# Patient Record
Sex: Female | Born: 1935 | Race: White | Hispanic: No | Marital: Married | Smoking: Never smoker
Health system: Southern US, Community
[De-identification: ages and names within clinical notes are randomized; demographics above are authoritative.]

## PROBLEM LIST (undated history)

## (undated) DIAGNOSIS — E079 Disorder of thyroid, unspecified: Secondary | ICD-10-CM

## (undated) DIAGNOSIS — R002 Palpitations: Secondary | ICD-10-CM

## (undated) DIAGNOSIS — I2510 Atherosclerotic heart disease of native coronary artery without angina pectoris: Secondary | ICD-10-CM

## (undated) DIAGNOSIS — E785 Hyperlipidemia, unspecified: Secondary | ICD-10-CM

## (undated) DIAGNOSIS — I2699 Other pulmonary embolism without acute cor pulmonale: Secondary | ICD-10-CM

## (undated) DIAGNOSIS — I10 Essential (primary) hypertension: Secondary | ICD-10-CM

## (undated) HISTORY — DX: Other pulmonary embolism without acute cor pulmonale: I26.99

## (undated) HISTORY — PX: ABDOMINAL HYSTERECTOMY: SHX81

## (undated) HISTORY — PX: TONSILLECTOMY: SUR1361

## (undated) HISTORY — PX: CHOLECYSTECTOMY: SHX55

## (undated) HISTORY — DX: Palpitations: R00.2

## (undated) HISTORY — PX: APPENDECTOMY: SHX54

## (undated) HISTORY — PX: OTHER SURGICAL HISTORY: SHX169

## (undated) HISTORY — DX: Atherosclerotic heart disease of native coronary artery without angina pectoris: I25.10

## (undated) HISTORY — DX: Hyperlipidemia, unspecified: E78.5

---

## 1999-09-16 ENCOUNTER — Other Ambulatory Visit: Admission: RE | Admit: 1999-09-16 | Discharge: 1999-09-16 | Payer: Self-pay | Admitting: Obstetrics & Gynecology

## 2000-07-31 ENCOUNTER — Inpatient Hospital Stay (HOSPITAL_COMMUNITY): Admission: EM | Admit: 2000-07-31 | Discharge: 2000-08-01 | Payer: Self-pay | Admitting: Cardiology

## 2000-11-22 ENCOUNTER — Emergency Department (HOSPITAL_COMMUNITY): Admission: EM | Admit: 2000-11-22 | Discharge: 2000-11-22 | Payer: Self-pay | Admitting: *Deleted

## 2001-10-13 ENCOUNTER — Emergency Department (HOSPITAL_COMMUNITY): Admission: EM | Admit: 2001-10-13 | Discharge: 2001-10-13 | Payer: Self-pay | Admitting: *Deleted

## 2001-10-14 ENCOUNTER — Emergency Department (HOSPITAL_COMMUNITY): Admission: EM | Admit: 2001-10-14 | Discharge: 2001-10-14 | Payer: Self-pay | Admitting: Internal Medicine

## 2001-11-24 ENCOUNTER — Other Ambulatory Visit: Admission: RE | Admit: 2001-11-24 | Discharge: 2001-11-24 | Payer: Self-pay | Admitting: Obstetrics & Gynecology

## 2002-01-26 ENCOUNTER — Emergency Department (HOSPITAL_COMMUNITY): Admission: EM | Admit: 2002-01-26 | Discharge: 2002-01-26 | Payer: Self-pay | Admitting: Emergency Medicine

## 2002-01-27 ENCOUNTER — Encounter: Payer: Self-pay | Admitting: Internal Medicine

## 2002-01-27 ENCOUNTER — Observation Stay (HOSPITAL_COMMUNITY): Admission: EM | Admit: 2002-01-27 | Discharge: 2002-01-28 | Payer: Self-pay | Admitting: Internal Medicine

## 2002-01-27 ENCOUNTER — Encounter: Payer: Self-pay | Admitting: Family Medicine

## 2002-01-28 ENCOUNTER — Encounter: Payer: Self-pay | Admitting: Family Medicine

## 2002-05-15 ENCOUNTER — Encounter (INDEPENDENT_AMBULATORY_CARE_PROVIDER_SITE_OTHER): Payer: Self-pay | Admitting: *Deleted

## 2002-05-15 ENCOUNTER — Encounter (INDEPENDENT_AMBULATORY_CARE_PROVIDER_SITE_OTHER): Payer: Self-pay | Admitting: Internal Medicine

## 2002-05-15 ENCOUNTER — Ambulatory Visit (HOSPITAL_COMMUNITY): Admission: RE | Admit: 2002-05-15 | Discharge: 2002-05-15 | Payer: Self-pay | Admitting: Internal Medicine

## 2002-09-18 ENCOUNTER — Encounter (INDEPENDENT_AMBULATORY_CARE_PROVIDER_SITE_OTHER): Payer: Self-pay | Admitting: Internal Medicine

## 2002-09-18 ENCOUNTER — Ambulatory Visit (HOSPITAL_COMMUNITY): Admission: RE | Admit: 2002-09-18 | Discharge: 2002-09-18 | Payer: Self-pay | Admitting: Internal Medicine

## 2002-10-12 ENCOUNTER — Ambulatory Visit (HOSPITAL_COMMUNITY): Admission: RE | Admit: 2002-10-12 | Discharge: 2002-10-12 | Payer: Self-pay | Admitting: Pulmonary Disease

## 2002-10-20 ENCOUNTER — Ambulatory Visit (HOSPITAL_COMMUNITY): Admission: RE | Admit: 2002-10-20 | Discharge: 2002-10-20 | Payer: Self-pay | Admitting: Internal Medicine

## 2002-12-01 ENCOUNTER — Other Ambulatory Visit: Admission: RE | Admit: 2002-12-01 | Discharge: 2002-12-01 | Payer: Self-pay | Admitting: Obstetrics & Gynecology

## 2003-11-20 ENCOUNTER — Ambulatory Visit (HOSPITAL_COMMUNITY): Admission: RE | Admit: 2003-11-20 | Discharge: 2003-11-20 | Payer: Self-pay | Admitting: Ophthalmology

## 2003-12-18 ENCOUNTER — Ambulatory Visit (HOSPITAL_COMMUNITY): Admission: RE | Admit: 2003-12-18 | Discharge: 2003-12-18 | Payer: Self-pay | Admitting: Ophthalmology

## 2004-06-17 ENCOUNTER — Encounter: Admission: RE | Admit: 2004-06-17 | Discharge: 2004-06-17 | Payer: Self-pay | Admitting: Orthopedic Surgery

## 2004-06-18 ENCOUNTER — Ambulatory Visit (HOSPITAL_BASED_OUTPATIENT_CLINIC_OR_DEPARTMENT_OTHER): Admission: RE | Admit: 2004-06-18 | Discharge: 2004-06-18 | Payer: Self-pay | Admitting: Orthopedic Surgery

## 2004-06-18 ENCOUNTER — Ambulatory Visit (HOSPITAL_COMMUNITY): Admission: RE | Admit: 2004-06-18 | Discharge: 2004-06-18 | Payer: Self-pay | Admitting: Orthopedic Surgery

## 2004-06-18 ENCOUNTER — Encounter (INDEPENDENT_AMBULATORY_CARE_PROVIDER_SITE_OTHER): Payer: Self-pay | Admitting: *Deleted

## 2004-10-02 ENCOUNTER — Ambulatory Visit (HOSPITAL_COMMUNITY): Admission: RE | Admit: 2004-10-02 | Discharge: 2004-10-02 | Payer: Self-pay | Admitting: Pulmonary Disease

## 2004-11-07 ENCOUNTER — Ambulatory Visit: Payer: Self-pay

## 2004-12-23 ENCOUNTER — Other Ambulatory Visit: Admission: RE | Admit: 2004-12-23 | Discharge: 2004-12-23 | Payer: Self-pay | Admitting: Obstetrics & Gynecology

## 2005-03-17 ENCOUNTER — Emergency Department (HOSPITAL_COMMUNITY): Admission: EM | Admit: 2005-03-17 | Discharge: 2005-03-18 | Payer: Self-pay | Admitting: *Deleted

## 2005-06-29 ENCOUNTER — Ambulatory Visit: Payer: Self-pay | Admitting: Internal Medicine

## 2005-07-01 ENCOUNTER — Ambulatory Visit (HOSPITAL_COMMUNITY): Admission: RE | Admit: 2005-07-01 | Discharge: 2005-07-01 | Payer: Self-pay | Admitting: Internal Medicine

## 2005-07-17 ENCOUNTER — Ambulatory Visit: Payer: Self-pay | Admitting: Gastroenterology

## 2005-07-20 ENCOUNTER — Ambulatory Visit: Payer: Self-pay | Admitting: Cardiology

## 2005-08-12 ENCOUNTER — Ambulatory Visit (HOSPITAL_COMMUNITY): Admission: RE | Admit: 2005-08-12 | Discharge: 2005-08-12 | Payer: Self-pay | Admitting: Gastroenterology

## 2005-08-12 ENCOUNTER — Ambulatory Visit: Payer: Self-pay | Admitting: Gastroenterology

## 2005-09-01 ENCOUNTER — Inpatient Hospital Stay (HOSPITAL_COMMUNITY): Admission: RE | Admit: 2005-09-01 | Discharge: 2005-09-07 | Payer: Self-pay | Admitting: Surgery

## 2006-01-26 ENCOUNTER — Inpatient Hospital Stay (HOSPITAL_COMMUNITY): Admission: EM | Admit: 2006-01-26 | Discharge: 2006-01-31 | Payer: Self-pay | Admitting: Emergency Medicine

## 2006-02-02 ENCOUNTER — Ambulatory Visit: Payer: Self-pay | Admitting: Cardiology

## 2006-02-05 ENCOUNTER — Ambulatory Visit: Payer: Self-pay | Admitting: *Deleted

## 2006-02-11 ENCOUNTER — Ambulatory Visit: Payer: Self-pay | Admitting: *Deleted

## 2006-02-11 ENCOUNTER — Emergency Department (HOSPITAL_COMMUNITY): Admission: EM | Admit: 2006-02-11 | Discharge: 2006-02-11 | Payer: Self-pay | Admitting: Emergency Medicine

## 2006-02-19 ENCOUNTER — Ambulatory Visit: Payer: Self-pay | Admitting: Cardiology

## 2006-03-02 ENCOUNTER — Ambulatory Visit: Payer: Self-pay | Admitting: *Deleted

## 2006-03-16 ENCOUNTER — Ambulatory Visit: Payer: Self-pay | Admitting: *Deleted

## 2006-04-01 ENCOUNTER — Ambulatory Visit: Payer: Self-pay | Admitting: Cardiology

## 2006-04-19 ENCOUNTER — Ambulatory Visit: Payer: Self-pay | Admitting: Cardiology

## 2006-05-17 ENCOUNTER — Ambulatory Visit: Payer: Self-pay | Admitting: Cardiology

## 2006-05-26 ENCOUNTER — Ambulatory Visit: Payer: Self-pay | Admitting: Cardiology

## 2006-05-28 ENCOUNTER — Ambulatory Visit: Payer: Self-pay | Admitting: Cardiology

## 2006-05-28 ENCOUNTER — Encounter (HOSPITAL_COMMUNITY): Admission: RE | Admit: 2006-05-28 | Discharge: 2006-06-27 | Payer: Self-pay | Admitting: Cardiology

## 2006-05-31 ENCOUNTER — Ambulatory Visit (HOSPITAL_COMMUNITY): Admission: RE | Admit: 2006-05-31 | Discharge: 2006-05-31 | Payer: Self-pay | Admitting: *Deleted

## 2006-06-03 ENCOUNTER — Ambulatory Visit: Payer: Self-pay | Admitting: Cardiology

## 2006-06-29 ENCOUNTER — Ambulatory Visit: Payer: Self-pay | Admitting: Cardiology

## 2006-07-27 ENCOUNTER — Ambulatory Visit: Payer: Self-pay | Admitting: Cardiology

## 2006-08-06 ENCOUNTER — Ambulatory Visit: Payer: Self-pay | Admitting: Cardiology

## 2006-12-03 ENCOUNTER — Ambulatory Visit (HOSPITAL_COMMUNITY): Admission: RE | Admit: 2006-12-03 | Discharge: 2006-12-03 | Payer: Self-pay | Admitting: Pulmonary Disease

## 2006-12-27 ENCOUNTER — Emergency Department (HOSPITAL_COMMUNITY): Admission: EM | Admit: 2006-12-27 | Discharge: 2006-12-27 | Payer: Self-pay | Admitting: Emergency Medicine

## 2008-02-02 ENCOUNTER — Ambulatory Visit (HOSPITAL_COMMUNITY): Admission: RE | Admit: 2008-02-02 | Discharge: 2008-02-02 | Payer: Self-pay | Admitting: Pulmonary Disease

## 2008-03-31 ENCOUNTER — Ambulatory Visit: Payer: Self-pay | Admitting: Cardiology

## 2008-04-02 ENCOUNTER — Inpatient Hospital Stay (HOSPITAL_COMMUNITY): Admission: AD | Admit: 2008-04-02 | Discharge: 2008-04-03 | Payer: Self-pay | Admitting: Cardiology

## 2008-04-11 ENCOUNTER — Ambulatory Visit: Payer: Self-pay | Admitting: Cardiology

## 2008-05-22 ENCOUNTER — Ambulatory Visit: Payer: Self-pay | Admitting: Cardiology

## 2008-10-16 ENCOUNTER — Encounter: Payer: Self-pay | Admitting: Cardiology

## 2008-10-16 ENCOUNTER — Ambulatory Visit: Payer: Self-pay | Admitting: Cardiology

## 2008-10-23 ENCOUNTER — Ambulatory Visit: Payer: Self-pay

## 2009-02-12 ENCOUNTER — Encounter: Payer: Self-pay | Admitting: Cardiology

## 2009-03-08 ENCOUNTER — Encounter (INDEPENDENT_AMBULATORY_CARE_PROVIDER_SITE_OTHER): Payer: Self-pay | Admitting: *Deleted

## 2009-03-10 ENCOUNTER — Encounter: Payer: Self-pay | Admitting: Cardiology

## 2009-03-10 ENCOUNTER — Emergency Department (HOSPITAL_COMMUNITY): Admission: EM | Admit: 2009-03-10 | Discharge: 2009-03-10 | Payer: Self-pay | Admitting: Emergency Medicine

## 2009-03-11 ENCOUNTER — Telehealth: Payer: Self-pay | Admitting: Cardiology

## 2009-03-12 ENCOUNTER — Telehealth: Payer: Self-pay | Admitting: Cardiology

## 2009-03-12 ENCOUNTER — Ambulatory Visit: Payer: Self-pay | Admitting: Cardiology

## 2009-03-12 DIAGNOSIS — R002 Palpitations: Secondary | ICD-10-CM

## 2009-03-12 DIAGNOSIS — R079 Chest pain, unspecified: Secondary | ICD-10-CM | POA: Insufficient documentation

## 2009-03-12 DIAGNOSIS — E039 Hypothyroidism, unspecified: Secondary | ICD-10-CM

## 2009-03-12 DIAGNOSIS — E119 Type 2 diabetes mellitus without complications: Secondary | ICD-10-CM | POA: Insufficient documentation

## 2009-03-12 DIAGNOSIS — E785 Hyperlipidemia, unspecified: Secondary | ICD-10-CM

## 2009-03-12 DIAGNOSIS — Z86711 Personal history of pulmonary embolism: Secondary | ICD-10-CM

## 2009-03-15 ENCOUNTER — Telehealth: Payer: Self-pay | Admitting: Cardiology

## 2009-05-23 ENCOUNTER — Encounter: Payer: Self-pay | Admitting: Cardiology

## 2009-12-23 ENCOUNTER — Telehealth: Payer: Self-pay | Admitting: Cardiology

## 2009-12-24 DIAGNOSIS — I214 Non-ST elevation (NSTEMI) myocardial infarction: Secondary | ICD-10-CM | POA: Insufficient documentation

## 2009-12-24 DIAGNOSIS — I10 Essential (primary) hypertension: Secondary | ICD-10-CM

## 2009-12-24 DIAGNOSIS — I2510 Atherosclerotic heart disease of native coronary artery without angina pectoris: Secondary | ICD-10-CM

## 2009-12-25 ENCOUNTER — Ambulatory Visit: Payer: Self-pay | Admitting: Cardiology

## 2009-12-25 DIAGNOSIS — R609 Edema, unspecified: Secondary | ICD-10-CM

## 2010-04-02 ENCOUNTER — Ambulatory Visit: Payer: Self-pay | Admitting: Cardiology

## 2010-04-02 DIAGNOSIS — R0602 Shortness of breath: Secondary | ICD-10-CM | POA: Insufficient documentation

## 2010-04-15 ENCOUNTER — Telehealth (INDEPENDENT_AMBULATORY_CARE_PROVIDER_SITE_OTHER): Payer: Self-pay | Admitting: *Deleted

## 2010-04-16 ENCOUNTER — Encounter (HOSPITAL_COMMUNITY): Admission: RE | Admit: 2010-04-16 | Discharge: 2010-06-10 | Payer: Self-pay | Admitting: Cardiology

## 2010-04-16 ENCOUNTER — Ambulatory Visit: Payer: Self-pay | Admitting: Cardiovascular Disease

## 2010-04-16 ENCOUNTER — Encounter: Payer: Self-pay | Admitting: Cardiovascular Disease

## 2010-04-16 ENCOUNTER — Ambulatory Visit: Payer: Self-pay

## 2010-04-28 ENCOUNTER — Telehealth: Payer: Self-pay | Admitting: Cardiovascular Disease

## 2010-08-20 ENCOUNTER — Encounter (INDEPENDENT_AMBULATORY_CARE_PROVIDER_SITE_OTHER): Payer: Self-pay | Admitting: *Deleted

## 2010-09-09 NOTE — Progress Notes (Signed)
Summary: Disuss medication   Phone Note Call from Patient Call back at Home Phone [phone]   Caller: Patient Reason for Call: Talk to Nurse Details for Reason: Discuss medication  Initial call taken by: Lorne Skeens,  Dec 23, 2009 11:41 AM  Follow-up for Phone Call        SPOKE WITH PT  RECIEVED LETTER STATING TIME  TO SCHEDULE 6 MO APPT  WANTING TO SEE DR Demira Gwynne IN [place] NOTHING AVAILABLE UNTIL JULY PER PT WILL KEEP APPT FOR [place] OFFICE ON 12/25/09. Follow-up by: Scherrie Bateman, LPN,  Dec 23, 2009 2:06 PM

## 2010-09-09 NOTE — Assessment & Plan Note (Signed)
Summary: 3 month rov.sl   Visit Type:  3 mo f/u Primary Abel Ra:  Shaune Pollack  CC:  pt states she had some chest discomfort on the real hot days....sob as well on hot days....denies any edema.  History of Present Illness: [name] [name] comes in today complaining of shortness of breath and chest tightness with the extreme heat conditions. She has a history of coronary disease status post drug-eluting stent to the right coronary artery. She has normal left ventricular function. Stress Myoview in March of 2000 and showed no significant ischemia.  She denies any pleuritic chest pain, hemoptysis, or sudden change in shortness of breath. She has a history of a pulmonary embolus.  Her blood pressure is under good control. Her weight has been stable. She denies PND, orthopnea, or edema.  Current Medications (verified): 1)  Tricor 145 Mg Tabs (Fenofibrate) .Marland Kitchen.. 1 Tab Once Daily 2)  Synthroid 200 Mcg Tabs (Levothyroxine Sodium) .Marland Kitchen.. 1 Tab Once Daily 3)  Crestor 20 Mg Tabs (Rosuvastatin Calcium) .Marland Kitchen.. 1 Tab Once Daily 4)  Plavix 75 Mg Tabs (Clopidogrel Bisulfate) .Marland Kitchen.. 1 Tab Once Daily 5)  Metoprolol Tartrate 25 Mg Tabs (Metoprolol Tartrate) .Marland Kitchen.. 1 Tab Two Times A Day 6)  Aspirin 81 Mg Tbec (Aspirin) .... Take One Tablet By Mouth Daily 7)  Novolin 70/30 70-30 % Susp (Insulin Isophane & Regular) .... Take As Directed 8)  Nitroglycerin 0.4 Mg Subl (Nitroglycerin) .... One Tablet Under Tongue Every 5 Minutes As Needed For Chest Pain---May Repeat Times Three 9)  Furosemide 20 Mg Tabs (Furosemide) .Marland Kitchen.. 1 Once Daily 10)  Klor-Con M20 20 Meq Cr-Tabs (Potassium Chloride Crys Cr) .Marland Kitchen.. 1 Once Daily  Allergies (verified): No Known Drug Allergies  Past History:  Past Medical History: Last updated: 12/24/2009 MYOCARDIAL INFARCTION, ACUTE, SUBENDOCARDIAL (ICD-410.70) CAD, NATIVE VESSEL (ICD-414.01) CHEST PAIN-UNSPECIFIED (ICD-786.50) PALPITATIONS (ICD-785.1) HYPERTENSION (ICD-401.9) HYPERLIPIDEMIA-MIXED  (ICD-272.4) PE (ICD-415.19) DM (ICD-250.00) HYPOTHYROIDISM (ICD-244.9)    Past Surgical History: Last updated: 03/12/2009 appendectomy,  cholecystectomy,   hysterectomy, tonsillectomy,  abdominal cavity operation   Family History: Last updated: 03/12/2009 Family History of Coronary Artery Disease:  Family History of Hypertension:   Social History: Last updated: 03/12/2009 Tobacco Use - No.  Alcohol Use - no Drug Use - no  Risk Factors: Smoking Status: never (03/12/2009)  Review of Systems       negative other than history of present illness  Vital Signs:  Patient profile:   77 year old female Height:      63 inches Weight:      170.8 pounds BMI:     30.37 Pulse rate:   76 / minute Pulse rhythm:   regular BP sitting:   120 / 60  (left arm) Cuff size:   large  Vitals Entered By: Danielle Rankin, CMA (April 02, 2010 3:06 PM)  Physical Exam  General:  obese.  no acute distress Head:  normocephalic and atraumatic Eyes:  glasses, otherwise normal Neck:  Neck supple, no JVD. No masses, thyromegaly or abnormal cervical nodes. Lungs:  Clear bilaterally to auscultation and percussion. Heart:  PMI nondisplaced, normal S1-S2, no gallop rub or murmur. Carotids are full with no bruits Msk:  decreased ROM.   Pulses:  pulses normal in all 4 extremities Extremities:  isolated edema or cyst of the left malleolus. Trace pretibial edema. Varicose veins without sign of DVT Neurologic:  Alert and oriented x 3. Skin:  Intact without lesions or rashes. Psych:  Normal affect.   Impression & Recommendations:  Problem #  1:  CAD, NATIVE VESSEL (ICD-414.01) I am concerned her symptoms are ischemic equivalent. We'll arrange exercise Myoview off metoprolol. Her updated medication list for this problem includes:    Plavix 75 Mg Tabs (Clopidogrel bisulfate) .Marland Kitchen... 1 tab once daily    Metoprolol Tartrate 25 Mg Tabs (Metoprolol tartrate) .Marland Kitchen... 1 tab two times a day    Aspirin 81 Mg Tbec  (Aspirin) .Marland Kitchen... Take one tablet by mouth daily    Nitroglycerin 0.4 Mg Subl (Nitroglycerin) ..... One tablet under tongue every 5 minutes as needed for chest pain---may repeat times three  Problem # 2:  PE (ICD-415.19) Assessment: Unchanged  Her updated medication list for this problem includes:    Plavix 75 Mg Tabs (Clopidogrel bisulfate) .Marland Kitchen... 1 tab once daily    Aspirin 81 Mg Tbec (Aspirin) .Marland Kitchen... Take one tablet by mouth daily  Problem # 3:  HYPERTENSION (ICD-401.9) Assessment: Improved  Her updated medication list for this problem includes:    Metoprolol Tartrate 25 Mg Tabs (Metoprolol tartrate) .Marland Kitchen... 1 tab two times a day    Aspirin 81 Mg Tbec (Aspirin) .Marland Kitchen... Take one tablet by mouth daily    Furosemide 20 Mg Tabs (Furosemide) .Marland Kitchen... 1 once daily  Problem # 4:  HYPERLIPIDEMIA-MIXED (ICD-272.4)  Her updated medication list for this problem includes:    Tricor 145 Mg Tabs (Fenofibrate) .Marland Kitchen... 1 tab once daily    Crestor 20 Mg Tabs (Rosuvastatin calcium) .Marland Kitchen... 1 tab once daily  Problem # 5:  PALPITATIONS (ICD-785.1) Assessment: Improved  Her updated medication list for this problem includes:    Plavix 75 Mg Tabs (Clopidogrel bisulfate) .Marland Kitchen... 1 tab once daily    Metoprolol Tartrate 25 Mg Tabs (Metoprolol tartrate) .Marland Kitchen... 1 tab two times a day    Aspirin 81 Mg Tbec (Aspirin) .Marland Kitchen... Take one tablet by mouth daily    Nitroglycerin 0.4 Mg Subl (Nitroglycerin) ..... One tablet under tongue every 5 minutes as needed for chest pain---may repeat times three  Clinical Reports Reviewed:  Cardiac Cath:  04/02/2008: Cardiac Cath Findings:   Conclusions:  1. Severe RCA Stenosis with Successful PCI using a Promus DES  2. Mild nonobstructive LAD/LCx stenosis  3. Normal LV function  Recommendations:  Aspirin and Plavix for a minimum of twelve months  Signed By: Tonny Bollman  MD On 04/02/2008 2:06:24 PM  ___________________________________  Tonny Bollman  MD   ___________________________________  Nuclear Study:  10/23/2008:  Excerise capacity: Adenosine study with no exercise  Blood Pressure response: Normal blood pressure response  Clinical symptoms: MIld chest pain/dyspnea  ECG impression: No significant ST segment change suggestive of ischemia  Overall impression: Probably normal adenosine nuclear study. Variable breast attenuation artifact affects basal anterior wall. LVEF 87% and normal three times a day ratio of 0.96.  Janit Bern, MD   05/28/2006:  NM Myo Perf Eject Fr.  Clinical data:   77 year old woman with exertional dyspnea.   ADENOSINE STRESS MYOVIEW STUDY:   RADIONUCLIDE DATA:  One day rest/stress protocol performed with   10.0/30.0 mCi Tc48m Myoview.   STRESS DATA:  Adenosine infusion resulted in malaise and chest   discomfort.  There was a moderate increase in heart rate, but no   substantial change in systolic blood pressure with drug   administration.  No arrhythmias noted.   EKG:  Normal sinus rhythm; delayed R-wave progression; low voltage in   the chest leads; otherwise, unremarkable.  No significant change with   Adenosine.   SCINTIGRAPHIC DATA:  Acquisition notable  for mild breast attenuation.   Left ventricular size was normal.  On tomographic images   reconstructed in standard planes, there was a small focal area of   thinning in the anteroseptal region.  This was not numerically   significant by quantitative analysis, nor was any reversibility   apparent.  The gated reconstruction demonstrated normal to   hyperdynamic regional and global LV systolic function as well as   normal systolic accentuation of activity throughout.  Estimated   ejection fraction exceeded .65.   IMPRESSION:   Negative pharmacologic stress Myoview study revealing no stress -   induced EKG abnormalities, normal left ventricular size, and normal   left ventricular systolic function.  By scintigraphic imaging, there   was a small  breast or right ventricular insertion artifact without   convincing evidence for ischemia or infarction.  Other findings as   noted.    Read By:  [place] Bing,  [name]   Released By:  [place] Bing,  [name]  Additional Information  External image : [phone]  11/07/2004:  Impression: With adenosine infusion there are slight EKG changes. The nuclear images are normal. There is no sign of scar or ischemia. There is normal wall motion. There is a high normal ejection fraction. the left ventricular cavity size is small.  Willa Rough, MD, [facility]  Appended Document: 3 month rov.sl    Clinical Lists Changes  Problems: Added new problem of SHORTNESS OF BREATH (ICD-786.05) Orders: Added new Referral order of Nuclear Stress Test (Nuc Stress Test) - Signed Observations: Added new observation of PI CARDIO: Your physician recommends that you schedule a follow-up appointment in: 6 months with Dr. Daleen Squibb Your physician recommends that you continue on your current medications as directed. Please refer to the Current Medication list given to you today. Your physician has requested that you have an exercise stress myoview.  For further information please visit [email or web address]  Please follow instruction sheet, as given. (04/02/2010 16:00)       Patient Instructions: 1)  Your physician recommends that you schedule a follow-up appointment in: 6 months with Dr. Daleen Squibb 2)  Your physician recommends that you continue on your current medications as directed. Please refer to the Current Medication list given to you today. 3)  Your physician has requested that you have an exercise stress myoview.  For further information please visit [email or web address]  Please follow instruction sheet, as given.

## 2010-09-09 NOTE — Assessment & Plan Note (Signed)
Summary: rov   Visit Type:  rov Primary Provider:  DR. Juanetta Gosling  CC:  sob easily...edema/legs...denies any cp.  History of Present Illness: [name] [name] comes in today for evaluation management of her coronary artery disease, history of hypertension, history of pulmonary embolus, and lower extremity edema.  Her edema has gotten worse over the past several weeks. She says at the end of the day her feet are tight. Her weight is up 57 pounds.  She denies any true orthopnea, PND.  She's had no angina, pleuritic chest pain, or hemoptysis.  Current Medications (verified): 1)  Tricor 145 Mg Tabs (Fenofibrate) .Marland Kitchen.. 1 Tab Once Daily 2)  Amaryl 4 Mg Tabs (Glimepiride) .Marland Kitchen.. 1 Tab Once Daily 3)  Synthroid 200 Mcg Tabs (Levothyroxine Sodium) .Marland Kitchen.. 1 Tab Once Daily 4)  Amiloride-Hydrochlorothiazide 5-50 Mg Tabs (Amiloride-Hydrochlorothiazide) .Marland Kitchen.. 1 Tab Once Daily 5)  Crestor 20 Mg Tabs (Rosuvastatin Calcium) .Marland Kitchen.. 1 Tab Once Daily 6)  Plavix 75 Mg Tabs (Clopidogrel Bisulfate) .Marland Kitchen.. 1 Tab Once Daily 7)  Metoprolol Tartrate 25 Mg Tabs (Metoprolol Tartrate) .Marland Kitchen.. 1 Tab Two Times A Day 8)  Aspirin 81 Mg Tbec (Aspirin) .... Take One Tablet By Mouth Daily 9)  Insulin .... As Directed 10)  Nitroglycerin 0.4 Mg Subl (Nitroglycerin) .... One Tablet Under Tongue Every 5 Minutes As Needed For Chest Pain---May Repeat Times Three  Allergies (verified): No Known Drug Allergies  Review of Systems       negative other than history of present illness  Vital Signs:  Patient profile:   77 year old female Height:      63 inches Weight:      172 pounds BMI:     30.58 Pulse rate:   79 / minute Pulse rhythm:   regular BP sitting:   138 / 70  (left arm) Cuff size:   large  Vitals Entered By: Danielle Rankin, CMA (Dec 25, 2009 3:31 PM)  Physical Exam  General:  Well developed, well nourished, in no acute distress. Head:  normocephalic and atraumatic Eyes:  PERRLA/EOM intact; conjunctiva and lids normal. Neck:   Neck supple, no JVD. No masses, thyromegaly or abnormal cervical nodes. Lungs:  Clear bilaterally to auscultation and percussion. Heart:  Non-displaced PMI, chest non-tender; regular rate and rhythm, S1, S2 without murmurs, rubs or gallops. Carotid upstroke normal, no bruit. Normal abdominal aortic size, no bruits. Femorals normal pulses, no bruits. Pedals normal pulses. No edema, no varicosities. Abdomen:  Bowel sounds positive; abdomen soft and non-tender without masses, organomegaly, or hernias noted. No hepatosplenomegaly. Msk:  decreased ROM.   Pulses:  pulses normal in all 4 extremities Extremities:  1-2+ pitting edema at the ankle, no sign of DVT Neurologic:  Alert and oriented x 3. Skin:  Intact without lesions or rashes. Psych:  Normal affect.   Problems:  Medical Problems Added: 1)  Dx of Edema  (ICD-782.3)  Impression & Recommendations:  Problem # 1:  CAD, NATIVE VESSEL (ICD-414.01) Assessment Unchanged  Her updated medication list for this problem includes:    Plavix 75 Mg Tabs (Clopidogrel bisulfate) .Marland Kitchen... 1 tab once daily    Metoprolol Tartrate 25 Mg Tabs (Metoprolol tartrate) .Marland Kitchen... 1 tab two times a day    Aspirin 81 Mg Tbec (Aspirin) .Marland Kitchen... Take one tablet by mouth daily    Nitroglycerin 0.4 Mg Subl (Nitroglycerin) ..... One tablet under tongue every 5 minutes as needed for chest pain---may repeat times three  Orders: EKG w/ Interpretation (93000)  Problem # 2:  MYOCARDIAL  INFARCTION, ACUTE, SUBENDOCARDIAL (ICD-410.70) Assessment: Unchanged  Her updated medication list for this problem includes:    Plavix 75 Mg Tabs (Clopidogrel bisulfate) .Marland Kitchen... 1 tab once daily    Metoprolol Tartrate 25 Mg Tabs (Metoprolol tartrate) .Marland Kitchen... 1 tab two times a day    Aspirin 81 Mg Tbec (Aspirin) .Marland Kitchen... Take one tablet by mouth daily    Nitroglycerin 0.4 Mg Subl (Nitroglycerin) ..... One tablet under tongue every 5 minutes as needed for chest pain---may repeat times three  Problem #  3:  PALPITATIONS (ICD-785.1) Assessment: Improved  Her updated medication list for this problem includes:    Plavix 75 Mg Tabs (Clopidogrel bisulfate) .Marland Kitchen... 1 tab once daily    Metoprolol Tartrate 25 Mg Tabs (Metoprolol tartrate) .Marland Kitchen... 1 tab two times a day    Aspirin 81 Mg Tbec (Aspirin) .Marland Kitchen... Take one tablet by mouth daily    Nitroglycerin 0.4 Mg Subl (Nitroglycerin) ..... One tablet under tongue every 5 minutes as needed for chest pain---may repeat times three  Problem # 4:  HYPERTENSION (ICD-401.9) Assessment: Improved  Her updated medication list for this problem includes:    Amiloride-hydrochlorothiazide 5-50 Mg Tabs (Amiloride-hydrochlorothiazide) .Marland Kitchen... 1 tab once daily    Metoprolol Tartrate 25 Mg Tabs (Metoprolol tartrate) .Marland Kitchen... 1 tab two times a day    Aspirin 81 Mg Tbec (Aspirin) .Marland Kitchen... Take one tablet by mouth daily  Problem # 5:  PE (ICD-415.19) resolved, off Coumadin Her updated medication list for this problem includes:    Plavix 75 Mg Tabs (Clopidogrel bisulfate) .Marland Kitchen... 1 tab once daily    Aspirin 81 Mg Tbec (Aspirin) .Marland Kitchen... Take one tablet by mouth daily  Problem # 6:  HYPERLIPIDEMIA-MIXED (ICD-272.4)  Her updated medication list for this problem includes:    Tricor 145 Mg Tabs (Fenofibrate) .Marland Kitchen... 1 tab once daily    Crestor 20 Mg Tabs (Rosuvastatin calcium) .Marland Kitchen... 1 tab once daily  Problem # 7:  DM (ICD-250.00)  Her updated medication list for this problem includes:    Amaryl 4 Mg Tabs (Glimepiride) .Marland Kitchen... 1 tab once daily    Aspirin 81 Mg Tbec (Aspirin) .Marland Kitchen... Take one tablet by mouth daily  Problem # 8:  EDEMA (ICD-782.3) Assessment: Deteriorated I will change her HCTZ to furosemide 20 mg per day. Will also add potassium 20 mg a day. If her weight has not dropped 5 pounds or her swelling doesn't get remarkably better, we will increase to 40 mg per day.  Patient Instructions: 1)  Your physician recommends that you schedule a follow-up appointment in: 3 MONTHS WITH  DR Sotero Brinkmeyer 2)  Your physician has recommended you make the following change in your medication: STOP AMILORIDE/HCTZ 5/50 MG 3)  START FUROSEMIDE 20 MG 1 once daily 4)  KLOR CON (POTASSIUM 20 MEQ 1 once daily

## 2010-09-09 NOTE — Progress Notes (Signed)
Summary: Nuclear Pre-Procedure  Phone Note Outgoing Call   Call placed by: Milana Na, EMT-P,  April 15, 2010 4:43 PM Summary of Call: Reviewed information on Myoview Information Sheet (see scanned document for further details).  Spoke with patient.     Nuclear Med Background Indications for Stress Test: Evaluation for Ischemia, Stent Patency   History: Heart Catheterization, Myocardial Infarction, Myocardial Perfusion Study, Stents   Symptoms: Chest Tightness, SOB    Nuclear Pre-Procedure Cardiac Risk Factors: Carotid Disease, Family History - CAD, Hypertension, IDDM Type 2, Lipids Height (in): 63  Nuclear Med Study Referring MD:  T.Wall

## 2010-09-09 NOTE — Progress Notes (Signed)
Summary: some chest pain/results of stress test  Phone Note Call from Patient   Caller: Patient [phone] Reason for Call: Talk to Nurse Summary of Call: pt calling re pain in chest, starting yesterday-pt wants results of stress test -pls call   Initial call taken by: Glynda Jaeger,  April 28, 2010 11:47 AM  Follow-up for Phone Call        adv pt that stress test normal but not signed off by Dr. Juliann Pares. Pt states chest a little tight and took a nitro 5 minutes ago and "helped a little", adv can take q9m x 3 and if not relief call 911. Claris Gladden, RN, BSN 9/19 1155 pt adv chest pain is a lot better and still not shob. Claris Gladden, RN, BSN 9/19 [phone]

## 2010-09-09 NOTE — Assessment & Plan Note (Signed)
Summary: Cardiology Nuclear Testing  Nuclear Med Background Indications for Stress Test: Evaluation for Ischemia, Stent Patency   History: Heart Catheterization, Myocardial Infarction, Myocardial Perfusion Study, Stents  History Comments: '09 Stent-RCA; '10 [id number]  Symptoms: Chest Tightness, Palpitations, Rapid HR  Symptoms Comments: Last episode of CP:04/11/10.   Nuclear Pre-Procedure Cardiac Risk Factors: Carotid Disease, Family History - CAD, Hypertension, IDDM Type 2, Lipids Caffeine/Decaff Intake: None NPO After: 9:00 PM Lungs: Clear.  O2 Sat 98%. IV 0.9% NS with Angio Cath: 22g     IV Site: L Antecubital IV Started by: Bonnita Levan, RN Chest Size (in) 34     Cup Size B     Height (in): 63 Weight (lb): 168 BMI: 29.87  Nuclear Med Study 1 or 2 day study:  1 day     Stress Test Type:  Treadmill/Lexiscan Reading MD:  Charlton Haws, MD     Referring MD:  Valera Castle, MD Resting Radionuclide:  Technetium 41m Tetrofosmin     Resting Radionuclide Dose:  10.9 mCi  Stress Radionuclide:  Technetium 39m Tetrofosmin     Stress Radionuclide Dose:  32.8 mCi   Stress Protocol Exercise Time (min):  2:00 min     Max HR:  139 bpm     Predicted Max HR:  144 bpm  Max Systolic BP: 155 mm Hg     Percent Max HR:  96.53 %Rate Pressure Product:  40981  Lexiscan: 0.4 mg   Stress Test Technologist:  Rea [employer], CMA-N     Nuclear Technologist:  Doyne Keel, CNMT  Rest Procedure  Myocardial perfusion imaging was performed at rest 45 minutes following the intravenous administration of Technetium 22m Tetrofosmin.  Stress Procedure  The patient received IV Lexiscan 0.4 mg over 15-seconds with concurrent low level exercise and then technetium 56m Tetrofosmin was injected at 30-seconds.  There were no significant changes with infusion.  She did c/o chest tightness.  Quantitative spect images were obtained after a 45 minute delay.  QPS Raw Data Images:  Normal; no motion artifact; normal  heart/lung ratio. Stress Images:  Normal homogeneous uptake in all areas of the myocardium. Rest Images:  Normal homogeneous uptake in all areas of the myocardium. Subtraction (SDS):  SDS 2 Transient Ischemic Dilatation:  1.01  (Normal <1.22)  Lung/Heart Ratio:  0.25  (Normal <0.45)  Quantitative Gated Spect Images QGS EDV:  30 ml QGS ESV:  5 ml QGS EF:  82 % QGS cine images:  normal  Findings Normal nuclear study      Overall Impression  Exercise Capacity: Lexiscan with no exercise. BP Response: Normal blood pressure response. Clinical Symptoms: Chest Tightness ECG Impression: Baseline ECG with flat lateral ST segments got slightly worse with bolus Overall Impression: Normal stress nuclear study.  Appended Document: Cardiology Nuclear Testing excellent. no change in treatment.  Appended Document: Cardiology Nuclear Testing No answer. No answering machine. Will call back  Mylo Red RN

## 2010-09-11 NOTE — Letter (Signed)
Summary: Appointment - Reschedule  [employer], Main Office  [address]   [place], [place] [address]   Phone: [phone]  Fax: [phone]     August 20, 2010 MRN: [id number]   [facility] [address] [place], [place]  [address]   Dear Ms. Marbach,   Due to a change in our office schedule, your appointment on  Feb 7,2012                     at  2:30 p.m.  must be changed.  It is very important that we reach you to reschedule this appointment. We look forward to participating in your health care needs. Please contact us at the number listed above at your earliest convenience to reschedule this appointment.     Sincerely,      Lorne Skeens  [facility] Scheduling Team

## 2010-09-15 ENCOUNTER — Encounter: Payer: Self-pay | Admitting: Cardiology

## 2010-09-16 ENCOUNTER — Encounter: Payer: Self-pay | Admitting: Cardiology

## 2010-09-16 ENCOUNTER — Ambulatory Visit (INDEPENDENT_AMBULATORY_CARE_PROVIDER_SITE_OTHER): Payer: MEDICARE | Admitting: Cardiology

## 2010-09-16 DIAGNOSIS — I2510 Atherosclerotic heart disease of native coronary artery without angina pectoris: Secondary | ICD-10-CM

## 2010-09-16 DIAGNOSIS — I214 Non-ST elevation (NSTEMI) myocardial infarction: Secondary | ICD-10-CM

## 2010-09-25 NOTE — Assessment & Plan Note (Signed)
Summary: f85m/dfg per pt call-mj rs from [email or web address]/ep   Visit Type:  51mo follow up Primary Provider:  Shaune Pollack  CC:  occassionally feels tightness. pt going thru lots of stress at this time.  no other complaints today.  History of Present Illness: [name] [name] comes in today for further evaluation and management of coronary artery disease.  She's had symptoms of angina or ischemia. She's had very few palpitations. She denies any presyncope or syncope.  She is very emotional today about the murders  in [place]. They were part of her family.  She denies any chest pain, shortness of breath, lower extremity edema. She denies any bleeding.  Her EKG and rhythm with no specific ST segment changes, stable  Current Medications (verified): 1)  Tricor 145 Mg Tabs (Fenofibrate) .Marland Kitchen.. 1 Tab Once Daily 2)  Synthroid 200 Mcg Tabs (Levothyroxine Sodium) .Marland Kitchen.. 1 Tab Once Daily 3)  Crestor 20 Mg Tabs (Rosuvastatin Calcium) .Marland Kitchen.. 1 Tab Once Daily 4)  Plavix 75 Mg Tabs (Clopidogrel Bisulfate) .Marland Kitchen.. 1 Tab Once Daily 5)  Metoprolol Tartrate 25 Mg Tabs (Metoprolol Tartrate) .Marland Kitchen.. 1 Tab Two Times A Day 6)  Aspirin 81 Mg Tbec (Aspirin) .... Take One Tablet By Mouth Daily 7)  Novolin 70/30 70-30 % Susp (Insulin Isophane & Regular) .... Take As Directed 8)  Nitroglycerin 0.4 Mg Subl (Nitroglycerin) .... One Tablet Under Tongue Every 5 Minutes As Needed For Chest Pain---May Repeat Times Three 9)  Furosemide 20 Mg Tabs (Furosemide) .Marland Kitchen.. 1 Once Daily 10)  Klor-Con M20 20 Meq Cr-Tabs (Potassium Chloride Crys Cr) .Marland Kitchen.. 1 Once Daily  Allergies (verified): No Known Drug Allergies  Past History:  Past Medical History: Last updated: 12/24/2009 MYOCARDIAL INFARCTION, ACUTE, SUBENDOCARDIAL (ICD-410.70) CAD, NATIVE VESSEL (ICD-414.01) CHEST PAIN-UNSPECIFIED (ICD-786.50) PALPITATIONS (ICD-785.1) HYPERTENSION (ICD-401.9) HYPERLIPIDEMIA-MIXED (ICD-272.4) PE (ICD-415.19) DM (ICD-250.00) HYPOTHYROIDISM  (ICD-244.9)    Past Surgical History: Last updated: 03/12/2009 appendectomy,  cholecystectomy,   hysterectomy, tonsillectomy,  abdominal cavity operation   Family History: Last updated: 03/12/2009 Family History of Coronary Artery Disease:  Family History of Hypertension:   Social History: Last updated: 03/12/2009 Tobacco Use - No.  Alcohol Use - no Drug Use - no  Risk Factors: Smoking Status: never (03/12/2009)  Review of Systems       negative other than history of present illness  Vital Signs:  Patient profile:   77 year old female Height:      63 inches Weight:      165.75 pounds BMI:     29.47 Pulse rate:   71 / minute Resp:     18 per minute BP sitting:   126 / 66  (left arm) Cuff size:   large  Vitals Entered By: Celestia Khat, CMA (September 16, 2010 2:46 PM)  Physical Exam  General:  obese.  no acute distress Head:  normocephalic and atraumatic Eyes:  PERRLA/EOM intact; conjunctiva and lids normal. Neck:  Neck supple, no JVD. No masses, thyromegaly or abnormal cervical nodes. Chest [name]:  no deformities or breast masses noted Lungs:  Clear bilaterally to auscultation and percussion. Heart:  PMI nondisplaced, regular rate and rhythm, normal S1-S2, no carotid bruits Msk:  decreased ROM.   Pulses:  pulses normal in all 4 extremities Extremities:  No clubbing or cyanosis. Neurologic:  Alert and oriented x 3. Skin:  Intact without lesions or rashes. Psych:  Normal affect.   Impression & Recommendations:  Problem # 1:  CAD, NATIVE VESSEL (ICD-414.01) Assessment Unchanged  Her updated medication  list for this problem includes:    Plavix 75 Mg Tabs (Clopidogrel bisulfate) .Marland Kitchen... 1 tab once daily    Metoprolol Tartrate 25 Mg Tabs (Metoprolol tartrate) .Marland Kitchen... 1 tab two times a day    Aspirin 81 Mg Tbec (Aspirin) .Marland Kitchen... Take one tablet by mouth daily    Nitroglycerin 0.4 Mg Subl (Nitroglycerin) ..... One tablet under tongue every 5 minutes as needed for  chest pain---may repeat times three  Orders: EKG w/ Interpretation (93000)  Problem # 2:  MYOCARDIAL INFARCTION, ACUTE, SUBENDOCARDIAL (ICD-410.70) Assessment: Unchanged  Her updated medication list for this problem includes:    Plavix 75 Mg Tabs (Clopidogrel bisulfate) .Marland Kitchen... 1 tab once daily    Metoprolol Tartrate 25 Mg Tabs (Metoprolol tartrate) .Marland Kitchen... 1 tab two times a day    Aspirin 81 Mg Tbec (Aspirin) .Marland Kitchen... Take one tablet by mouth daily    Nitroglycerin 0.4 Mg Subl (Nitroglycerin) ..... One tablet under tongue every 5 minutes as needed for chest pain---may repeat times three  Orders: EKG w/ Interpretation (93000)  Problem # 3:  PE (ICD-415.19) Assessment: Improved  Her updated medication list for this problem includes:    Plavix 75 Mg Tabs (Clopidogrel bisulfate) .Marland Kitchen... 1 tab once daily    Aspirin 81 Mg Tbec (Aspirin) .Marland Kitchen... Take one tablet by mouth daily  Problem # 4:  PALPITATIONS (ICD-785.1) Assessment: Improved  Her updated medication list for this problem includes:    Plavix 75 Mg Tabs (Clopidogrel bisulfate) .Marland Kitchen... 1 tab once daily    Metoprolol Tartrate 25 Mg Tabs (Metoprolol tartrate) .Marland Kitchen... 1 tab two times a day    Aspirin 81 Mg Tbec (Aspirin) .Marland Kitchen... Take one tablet by mouth daily    Nitroglycerin 0.4 Mg Subl (Nitroglycerin) ..... One tablet under tongue every 5 minutes as needed for chest pain---may repeat times three  Patient Instructions: 1)  Your physician recommends that you schedule a follow-up appointment in: 6 months with Dr. Daleen Squibb 2)  Your physician recommends that you continue on your current medications as directed. Please refer to the Current Medication list given to you today.

## 2010-09-25 NOTE — Miscellaneous (Signed)
  Clinical Lists Changes  Observations: Added new observation of NUCLEAR NOS:  Overall Impression   Exercise Capacity: Lexiscan with no exercise. BP Response: Normal blood pressure response. Clinical Symptoms: Chest Tightness ECG Impression: Baseline ECG with flat lateral ST segments got slightly worse with bolus Overall Impression: Normal stress nuclear study.  (04/16/2010 9:31)      Nuclear Study  Procedure date:  04/16/2010  Findings:       Overall Impression   Exercise Capacity: Lexiscan with no exercise. BP Response: Normal blood pressure response. Clinical Symptoms: Chest Tightness ECG Impression: Baseline ECG with flat lateral ST segments got slightly worse with bolus Overall Impression: Normal stress nuclear study.

## 2010-11-15 LAB — BASIC METABOLIC PANEL
BUN: 16 mg/dL (ref 6–23)
CO2: 27 mEq/L (ref 19–32)
Calcium: 9.1 mg/dL (ref 8.4–10.5)
Chloride: 103 mEq/L (ref 96–112)
Creatinine, Ser: 0.88 mg/dL (ref 0.4–1.2)
GFR calc Af Amer: >60 mL/min (ref >60)
GFR calc non Af Amer: >60 mL/min (ref >60)
Glucose, Bld: 175 mg/dL — ABNORMAL HIGH (ref 70–99)
Potassium: 3.6 mEq/L (ref 3.5–5.1)
Sodium: 140 mEq/L (ref 135–145)

## 2010-11-15 LAB — DIFFERENTIAL
Basophils Absolute: 0 10*3/uL (ref 0.0–0.1)
Basophils Relative: 0 % (ref 0–1)
Eosinophils Absolute: 0.1 10*3/uL (ref 0.0–0.7)
Eosinophils Relative: 1 % (ref 0–5)
Lymphocytes Relative: 21 % (ref 12–46)
Lymphs Abs: 2.2 10*3/uL (ref 0.7–4.0)
Monocytes Absolute: 0.8 10*3/uL (ref 0.1–1.0)
Monocytes Relative: 8 % (ref 3–12)
Neutro Abs: 7.3 10*3/uL (ref 1.7–7.7)
Neutrophils Relative %: 70 % (ref 43–77)

## 2010-11-15 LAB — CBC
HCT: 40.3 % (ref 36.0–46.0)
Hemoglobin: 13.9 g/dL (ref 12.0–15.0)
MCHC: 34.6 g/dL (ref 30.0–36.0)
MCV: 90.1 fL (ref 78.0–100.0)
Platelets: 202 10*3/uL (ref 150–400)
RBC: 4.48 MIL/uL (ref 3.87–5.11)
RDW: 13.2 % (ref 11.5–15.5)
WBC: 10.5 10*3/uL (ref 4.0–10.5)

## 2010-11-15 LAB — POCT CARDIAC MARKERS
CKMB, poc: 1.3 ng/mL (ref 1.0–8.0)
Myoglobin, poc: 129 ng/mL (ref 12–200)
Troponin i, poc: <0.05 ng/mL (ref 0.00–0.09)

## 2010-11-15 LAB — D-DIMER, QUANTITATIVE: D-Dimer, Quant: 0.3 ug/mL-FEU (ref 0.00–0.48)

## 2010-12-17 LAB — HEPATIC FUNCTION PANEL: Bilirubin, Total: 0.4 mg/dL

## 2010-12-18 ENCOUNTER — Other Ambulatory Visit (HOSPITAL_COMMUNITY): Payer: Self-pay | Admitting: Pulmonary Disease

## 2010-12-18 ENCOUNTER — Ambulatory Visit (HOSPITAL_COMMUNITY)
Admission: RE | Admit: 2010-12-18 | Discharge: 2010-12-18 | Disposition: A | Discharge status: Home / Self Care | Payer: Medicare Other | Source: Ambulatory Visit | Attending: Pulmonary Disease | Admitting: Pulmonary Disease

## 2010-12-18 DIAGNOSIS — R079 Chest pain, unspecified: Secondary | ICD-10-CM | POA: Insufficient documentation

## 2010-12-23 NOTE — Assessment & Plan Note (Signed)
[facility]                            CARDIOLOGY OFFICE NOTE   DEVLIN, MCVEIGH                       MRN:          [id number]  DATE:05/22/2008                            DOB:          09/10/1931    Ms. Cottone comes in today for followup.  She is now several months out  from presenting with classic unstable angina and having a high-grade  right coronary proximal lesion stented by Dr. Tonny Bollman.  It was a  drug-eluting stent.  She is enrolled in the ADAPT trial.   She has a history of multiple pulmonary emboli in the past and had an  elevated D-dimer at that time, but a negative chest CT.   She had been followed by Dr. Shaune Pollack since that time.   Her biggest question today is that she like to come off some of her  medications.  Fortunately, I have reviewed them all with her and found  that only Premarin and Tricor are dispensable if absolutely necessary.  She is better off with them.  She needs to be on Plavix for at least a  year.   She has had one episode of chest tightness that required one sublingual  nitroglycerin about 2 weeks ago.  This was not exertion related.  She  had no other symptoms.   Her meds are unchanged from our last visit.   PHYSICAL EXAMINATION:  VITAL SIGNS:  Her blood pressure today is 142/73,  her pulse is 70 and regular, her weight is 172.  HEENT:  Normal.  NECK:  Carotid upstrokes were equal bilaterally without bruits, no JVD.  Thyroid is not enlarged.  Trachea is midline.  LUNGS:  Clear to auscultation.  HEART:  Reveals a nondisplaced PMI.  Normal S1 and S2.  ABDOMEN:  Soft, good bowel sounds.  No midline bruit.  No hepatomegaly.  EXTREMITIES:  No cyanosis, clubbing, or edema.  Pulses are intact.  NEURO:  Intact.   I had a long talk with Ms. Mcmeans today.  She will stay on Plavix until  next August 2010.  I will see her back again 6 months out from her stent  which will be in 3 months.  She will have  her blood work drawn by Dr.  Juanetta Gosling since she is not fasting today.     Thomas C. Daleen Squibb, MD, [facility]  Electronically Signed    TCW/MedQ  DD: 05/22/2008  DT: 05/23/2008  Job #: [id number]   cc:   Ramon Dredge L. Juanetta Gosling, [name]

## 2010-12-23 NOTE — Assessment & Plan Note (Signed)
[facility]                       [place] CARDIOLOGY OFFICE NOTE   CHANIQUA, BRISBY                       MRN:          [id number]  DATE:04/11/2008                            DOB:          1932-03-23    Ms. Talmadge returns today after being discharged from the hospital with  an acute coronary syndrome.   She presented on Friday, March 30, 2008, with classic unstable angina  that woke her up from sleep.  She was taken from [facility] ED by  [facility] to [facility].   She had a catheterization, which showed a high-grade right coronary  proximal lesion.  She underwent a drug-eluting stent by Dr. Tonny Bollman.  She was enrolled in the ADAPT trial.  She had slight bump in  her enzymes after procedure.   She also had an elevated D-dimer and had a negative chest CT.  She has  had a history of multiple pulmonary emboli in the past.   Her TSH was elevated with a history of hypothyroidism on Synthroid prior  to admission.   Since discharge, she has had an awareness in her chest.  She has not had  any typical symptoms like she did prior to her admission except briefly  on one occasion.  She has had no prolonged symptoms.   CURRENT MEDICATIONS:  1. Premarin 1.25 mg daily.  2. Amiloride/hydrochlorothiazide 5/50 daily.  3. Insulin 30 units daily.  4. Synthroid 200 mcg a day.  5. Amaryl 4 mg a day.  6. TriCor 145 mg a day.  7. Crestor 20 mg a day.  8. Nitrostat 0.4 p.r.n.  9. Plavix 75 mg a day.  10.Metoprolol 25 mg p.o. b.i.d.  11.Baby aspirin 81 mg a day.   PHYSICAL EXAMINATION:  GENERAL:  She looks remarkably good today.  She  is in no acute distress.  SKIN:  Warm and dry.  HEENT:  Normal.  NECK:  Carotid upstrokes were equal bilaterally without bruits, no JVD.  Thyroid is not enlarged.  Trachea is midline.  LUNGS:  Clear.  HEART:  Reveals a regular rate and rhythm.  No rub, murmur, or gallop.  ABDOMEN:  Soft, good bowel sounds.  No midline  bruit.  EXTREMITIES:  No cyanosis, clubbing, or edema.  Pulses are intact.  NEURO:  Intact.   Her electrocardiogram today shows normal sinus rhythm and a normal EKG.   Free T3, thyroxine level were normal.   ASSESSMENT AND PLAN:  I had a long talk with Ms. Kyllonen today.  I have  asked to continue her current medications.  We will see her in close  followup in 4-6 weeks.  I have approved for her to drive.   She will carry sublingual nitroglycerin.  She has recurrent symptoms  like she did on the morning of admission, she will take them as  instructed.  She knows how to activate 911.     Thomas C. Daleen Squibb, MD, [facility]  Electronically Signed    TCW/MedQ  DD: 04/11/2008  DT: 04/12/2008  Job #: [id number]   cc:   Ramon Dredge L. Juanetta Gosling, [name]

## 2010-12-23 NOTE — Discharge Summary (Signed)
[name] [name], [name] [name]                ACCOUNT NO.:  [id number]   MEDICAL RECORD NO.:  [id number]          PATIENT TYPE:  INP   LOCATION:  6532                         FACILITY:  [facility]   PHYSICIAN:  Veverly Fells. Excell Seltzer, MD  DATE OF BIRTH:  1931-11-09   DATE OF ADMISSION:  03/31/2008  DATE OF DISCHARGE:  04/03/2008                               DISCHARGE SUMMARY   DISCHARGE DIAGNOSES:  1. Acute coronary syndrome.  2. Coronary artery disease.  3. Status post drug-eluting stent of the proximal right coronary      artery (the Adapt trial.  4. Elevated D-dimer with negative chest CT.  5. Post-procedure non-ST elevated myocardial infarction by enzymes.  6. Hypertension.  7. Elevated TSH with a history of hyperthyroidism and on Synthroid      prior to admission, laboratories pending at the time of this      dictation.  8. Hyperlipidemia.  9. Hyperglycemia with a history of diabetes.   PROCEDURES PERFORMED:  Cardiac catheterization on April 02, 2008, with  drug-eluting stent to the proximal RCA (Adapt trial).   BRIEF HISTORY:  [name] [name] is a 77 year old white female who awakened  on the day of admission at approximately 5 a.m. with profound  diaphoresis.  She also noticed a heavy-like pain in her anterior chest  and tingling in her left arm associated with nausea.  She immediately  went to [facility] Emergency Room.  No acute changes on her EKG were  noted; however, on the time of transfer her enzymes were also within  normal limits.   PAST MEDICAL HISTORY:  1. Obesity.  2. Diabetes.  3. Hypothyroidism.  4. Hypertension.  5. Hyperlipidemia.  6. Remote pancreatitis with pancreatic pseudocyst removed.  7. History of DVT and pulmonary embolism in the past.   LABORATORY DATA:  Admission weight was 81 kg.  Admission hemoglobin and  hematocrit 13.6 and 41.0, normal indices, platelets 268, WBCs 9.9.  At  the time of discharge, hemoglobin and hematocrit were 11.6 and 34.4,  normal  indices, platelets of 212, WBCs 8.7.  PT 12.2.  D-dimer slightly  elevated at 0.63.  Sodium was 141, potassium 3.9, BUN 12, creatinine  0.9, glucose 136.  At the time of discharge, sodium was 142, potassium  3.6, BUN 10, creatinine 0.82, glucose 96.  CK totals, MBs, relative  indexes, and troponins were within normal limits times three.  However,  post-procedure CK total and MB had risen to 221 and 15.6 and troponin  was 1.82.  Subsequent CK-MB and troponin were declining.  On the April 01, 2008, fasting lipids showed a total cholesterol of 102,  triglycerides 316, HDL 37, LDL 2.  TSH was slightly elevated at 6.031.  Urinalysis was notable for glucose.  Stools were heme-positive on March 31, 2008, but negative on April 02, 2008.  At time of this dictation,  free T4 and T3 are pending.  Chest x-ray on March 31, 2008, did not  show any acute abnormalities.  Chest CT and lower extremity CT did not  show any DVTs or evidence of pulmonary embolism.  EKG showed normal  sinus rhythm, left axis deviation, early R-wave, nonspecific ST-T wave  changes.   HOSPITAL COURSE:  The patient was transferred from [facility]  to [facility] for further evaluation and accepted by Dr. Myrtis Ser.  She was placed on her home medications, as well as IV heparin, aspirin,  and beta blocker.  CT was performed, given her history of pulmonary  embolism and mild elevation of D-dimer.  This was unremarkable.  Thus, a  cardiac catheterization was arranged.  This was performed on April 02, 2008, by Dr. Excell Seltzer and she underwent a drug-eluting stent to a severe  RCA stenosis.  EF was 65%.  She was placed into the Adapt drug-eluting  stent trial.  Post-procedure she was ambulating without difficulty.  Catheterization site was intact.  It was noted that CK and troponin did  rise slightly post-procedure.  Given her slight elevation of TSH and her  hyperglycemia with history of diabetes, just prior to  discharge a  hemoglobin A1c, FT4, and T3 uptake were added to her labs that are  pending at the time of this dictation.  Dr. Excell Seltzer after review on  April 03, 2008, felt that the patient could be discharged home.   DISPOSITION:  The patient is discharged home on the afternoon of April 03, 2008.  She is asked to maintain a low-sodium, heart-healthy ADA  diet.  Her activities are restricted in regards to lifting, driving,  sexual activity, or heavy exertion for two weeks.  Wound care per  supplemental sheet post-catheterization.   New medications include:  1. Aspirin 325 mg daily.  2. Plavix 75 mg daily.  3. Nitroglycerin 0.4 as needed.  4. Metoprolol 25 mg b.i.d.  5. Lisinopril 10 mg daily.   She was asked to continue her home medications, which include:  1. Lantus 50 units at bedtime.  2. Premarin 0.45 mg daily.  3. Tricor 145 mg daily.  4. Levothyroxine 200 mcg daily.  5. Crestor 20 mg daily.  6. Amaryl 2 mg daily.   She was asked to bring all medications to all follow-up appointments.  She will see Dr. Vern Claude, PA-C in the [place] office on April 11, 2008, at 1:30.  She was asked to follow up with Dr. Juanetta Gosling in regards  to her  hyperglycemia and elevated TSH.  It was noted at the time of this  dictation, hemoglobin A1c, free T4, and T3 uptake are pending.   DISCHARGE TIME:  45 minutes.      Joellyn Rued, PA-C      Veverly Fells. Excell Seltzer, MD  Electronically Signed    EW/MEDQ  D:  04/03/2008  T:  04/03/2008  Job:  [id number]   cc:   Juanito Doom, [name]  [name], [name]

## 2010-12-23 NOTE — Assessment & Plan Note (Signed)
[name]                            CARDIOLOGY OFFICE NOTE   [name], [name]                       MRN:          [id number]  DATE:10/16/2008                            DOB:          [birth year]    [name] [name] comes in today.   She is now about 6 months out from her drug-eluting stent in the right  coronary artery.   She has been having some early a.m. chest tightness and palpitations.  She has also had some exertional chest tightness which is fairly  inconsistent.   CURRENT MEDICATIONS:  1. Amiloride hydrochlorothiazide 5/50 daily.  2. Insulin 30 units per day.  3. Synthroid 200 mcg a day.  4. Amaryl 4 mg a day.  5. TriCor 145 mg a day.  6. Crestor 20 mg per day.  7. Plavix 75 mg a day.  8. Metoprolol 25 mg p.o. b.i.d.  9. Aspirin 81 mg a day.  10.She carries sublingual nitroglycerin, taken a couple.  They seem to      help inconsistently.   Recent blood work that I have from February 2010 shows her glucose is  poorly controlled with hemoglobin A1c of 9.6%.  Her LFTs were normal.  Her total cholesterol is 124, triglycerides 303, HDL 41, LDL 22, VLDL  66.   PHYSICAL EXAMINATION:  VITAL SIGNS:  Her blood pressure is 136/70, her  pulse is 60 and regular, weight is 165 down 7.  HEENT:  Normal.  NECK:  Supple.  Carotid upstrokes were equal bilaterally without bruits,  no JVD.  Thyroid is not enlarged.  Trachea is midline.  LUNGS:  Clear to  auscultation and percussion.  HEART:  Soft S1 and S2.  No murmur, rub, or gallop.  ABDOMEN:  Soft, good bowel sounds.  No obvious organomegaly and no  tenderness.  EXTREMITIES:  No cyanosis, clubbing, or edema.  Pulses were present, but  reduced.  NEURO:  Intact.  There is no sign of DVT.   ASSESSMENT:  [name] [name] is having recurrent symptoms of possible  ischemia.  She is about 6 months out from a drug-eluting stent to the  right coronary artery.   PLAN:  1. Adenosine rest-stress Myoview.   If she has any significant      ischemia, she will need repeat catheterization.  2. No change in her medication.  3. I will reinforce the need for much better blood sugar control.   Her Myoview is negative for ischemia.  I will see her back in 6 months.     Thomas C. Daleen Squibb, MD, [name]  Electronically Signed    TCW/MedQ  DD: 10/16/2008  DT: 10/17/2008  Job #: [id number]   cc:   Ramon Dredge L. Juanetta Gosling, [name]

## 2010-12-23 NOTE — H&P (Signed)
[name], DOMKE                ACCOUNT NO.:  [id number]   MEDICAL RECORD NO.:  [id number]          PATIENT TYPE:  INP   LOCATION:  2908                         FACILITY:  [facility]   PHYSICIAN:  Luis Abed, MD, FACCDATE OF BIRTH:  [birth year]   DATE OF ADMISSION:  03/31/2008  DATE OF DISCHARGE:                              HISTORY & PHYSICAL   [name] [name] is 77 years old.  She is quite active historically.  In the  past, she had pancreatic problems and had a pancreatic pseudocyst  removed surgically.  Around that time, she had deep venous thrombosis  and multiple pulmonary emboli.  She was treated successfully with  Coumadin.  CT scanning in October 2007 revealed resolution of the  pulmonary emboli.  The patient has other medical problems including  other risk factors for coronary artery disease.  She has been quite  active recently and has had no significant symptoms.   This morning, at approximately 5:00 a.m., she awoke with significant  diaphoresis.  She had had a heavy pain in her chest and tingling in her  left arm.  There was a question that she had some difficulty moving her  left arm.  She also had nausea.  She went to the emergency room at [facility] and was treated immediately.  She had no acute ST change.  She was  then transferred to [place].  She arrives here and she is stable on cardiac  medications.  Her pain has resolved over this period of time.  Her first  point-of-care troponin in the emergency room at [facility] was normal.   ALLERGIES:  No known drug allergies.   MEDICATIONS:  1. Lantus 50 units subcu in the evening.  2. Synthroid 200 mcg daily.  3. TriCor 145 mg daily.  4. Amaryl, dose to be determined.  5. Crestor 20.  6. Premarin 0.45 daily.  7. Amlodipine and hydrochlorothiazide, dose to be determined.  8. Lovaza.   OTHER MEDICAL PROBLEMS:  See the complete list below.   SOCIAL HISTORY:  The patient lives with her husband in the [place]  area.   She is quite active.  She does not smoke and she has never  smoked.   FAMILY HISTORY:  There is a family history of coronary artery disease.   REVIEW OF SYSTEMS:  At this time, she feels much better.  Her nausea is  improved.  She is not having any other significant pains and otherwise,  her review of systems is negative.   PHYSICAL EXAMINATION:  VITAL SIGNS:  Currently her blood pressure is  147/58, heart rate is 91, respirations are 15, and her O2 saturation is  98 on 2 liters per minute.  GENERAL:  The patient is oriented to person, time, and place.  Affect is  normal.  HEENT:  No xanthelasma.  She has normal extraocular motion.  NECK:  There are no carotid bruits.  There is no jugular venous  distention.  LUNGS:  Clear.  Respiratory effort is not labored.  CARDIAC:  S1 and S2.  There are no clicks or  significant murmurs.  ABDOMEN:  Soft.  She has normal bowel sounds.  She has no significant  peripheral edema.   EKGs reveal only mild nonspecific ST-T wave changes.  This was seen when  she first came in and the repeat shows the same.  Hemoglobin is 13.6 and  platelets 268,000.  BUN 12, creatinine 0.9, glucose 136, potassium 3.9,  and calcium 9.2.  Her first troponin point-of-care was normal.  The next  is being repeated at this time.  Her D-dimer was mildly elevated at  0.63.  We do know that her D-dimer had normalized after her pulmonary  embolus in the past.  She had a chest x-ray that revealed no acute  cardiopulmonary abnormality.  Her INR was 0.9.   PROBLEMS:  1. History of pulmonary embolus in the past.  This was related to      surgery for her pancreatic pseudocyst.  A followup CT had shown      resolution of her pulmonary emboli and therefore, her Coumadin was      stopped in 2007.  Her D-dimer is currently mildly elevated.  I will      keep this in mind.  She has no findings in her legs.  We may well      proceed with a CT of the chest, but for first, I want to see  more      cardiac enzymes.  She is being treated with heparin.  2. Diabetes, on medications.  3. Lipids, on medications.  4. Hypertension, stable.  5. Hypothyroidism.  We will check a TSH.  6. History of ejection fraction of 65% with a negative Myoview in      2007.  7. History of pancreatitis with a pancreatic pseudocyst removed in the      past.  8* Acute illness, rule out myocardial infarction.  The patient awoke  diaphoretic with pain in her chest and left arm discomfort and nausea.  So far there is no EKG change and we have no enzyme abnormalities.  Her  pain is resolved on cardiac meds.  She is on aspirin and heparin.  Her  lipids will be treated on an ongoing basis with Crestor.  I have started  a beta-blocker.  Fasting lipid will be done.  Her other medications will  be continued.  Based on her course and her troponins, we will make  further decisions about her care.      Luis Abed, MD, [facility]  Electronically Signed     JDK/MEDQ  D:  03/31/2008  T:  03/31/2008  Job:  [id number]   cc:   Ramon Dredge L. Juanetta Gosling, [name]  Thomas C. Wall, MD, [facility]

## 2010-12-26 NOTE — Group Therapy Note (Signed)
NAMEGENELLE, [name] [name]                ACCOUNT NO.:  [id number]   MEDICAL RECORD NO.:  [id number]          PATIENT TYPE:  INP   LOCATION:  A216                          FACILITY:  [facility]   PHYSICIAN:  [name], M.D.DATE OF BIRTH:  06-18-1932   DATE OF PROCEDURE:  DATE OF DISCHARGE:                                   PROGRESS NOTE   [name] [name] is about the same.  She is admitted with pulmonary emboli,  diabetes.  She says she is still short of breath but better than she was.   PHYSICAL EXAMINATION:  VITAL SIGNS:  Her temperature is 98.1, pulse 84,  respirations are 18, blood sugar 174, it was 327.  Blood pressure 133/73, O2  sats 95% on 2 liters.  CHEST:  Clear with decreased breath sounds.  HEART:  Regular.  ABDOMEN:  Soft.   Pro time this morning 15.1 with an INR of 1.2.   ASSESSMENT:  She is better.  She is not fully anticoagulated yet.  My plan  is to continue with her Lovenox.  She still has some swelling of her right  leg as well and I have told her that may be a permanent problem because of a  clot in her leg and we will just simply have see how she does.  Her blood  sugar is better so I am happier with that.  We will plan to continue with  medications and follow.  I may reinstitute night time insulin.      [name], [name]  Electronically Signed     ELH/MEDQ  D:  01/28/2006  T:  01/28/2006  Job:  [id number]

## 2010-12-26 NOTE — Assessment & Plan Note (Signed)
[facility]                       [place] CARDIOLOGY OFFICE NOTE   MARYSA, WESSNER                       MRN:          [id number]  DATE:08/06/2006                            DOB:          03-03-1932    [name] [name] returns today for followup of her history of multiple  pulmonary emboli.  Please see my previous notes for details.   She has been on Coumadin and has been therapeutic and is therapeutic  today at 2.9.  She feels remarkably well.  She denies any chest pain,  pleuritic or otherwise.  She has had no hemoptysis.  She denies any  swelling or pain in her legs.   Her medications are the same as last visit.  Please refer to the  maintenance medication list.   Her blood pressure today is 140/80, her pulse is 86 and regular, her  weight is down 4 pounds to 175.  Her color looks remarkably good.  HEENT:  Normocephalic, atraumatic.  PERRLA.  Extraocular movements  intact.  Sclerae clear.  Facial symmetry is normal.  Dentition is  satisfactory.  The neck is supple, carotids are full, there is no JVD.  Thyroid is not enlarged.  Trachea is midline.  Lungs are clear, without  rub or rhonchi.  Heart reveals a normal S1, S2, no gallop, no right  ventricular lift.  Abdominal exam is soft with good bowel sounds.  Extremities reveal no edema.  There is no sign of DVT.  Pulses are  intact.  Neurologic exam is intact.  Musculoskeletal is intact.  Skin is  intact except for a few bruises.   I am delighted that Mrs. Haverstick is doing well.  As outlined in previous  notes, I think we will stop her Coumadin today.  I will see her back on  a p.r.n. basis.     Thomas C. Daleen Squibb, MD, [facility]  Electronically Signed    TCW/MedQ  DD: 08/06/2006  DT: 08/06/2006  Job #: [id number]   cc:   Ramon Dredge L. Juanetta Gosling, [name]

## 2010-12-26 NOTE — Discharge Summary (Signed)
[name]  Patient:    [name] [name], [name] [name]                       MRN: [id number] Adm. Date:  [id number] Disc. Date: 08/01/00 Attending:  Nelta Numbers Dictator:   Joellyn Rued, P.A.-C. CC:         Butch Penny, [name] - [place], [place]   Discharge Summary  DATE OF BIRTH:  October 03, 1931  HISTORY OF PRESENT ILLNESS:  [name] is a 77 year old white female who was transferred from [facility] for an evaluation of chest discomfort. She stated that at around 5 p.m. on the evening prior to admission, she had eaten at [employer].   At around 8 p.m. she developed an epigastric "hurting" which she gave a 3/10.  She took a Zantac and the duration of the discomfort lasted for less than 30 minutes.  She went on to work and performed her duties without difficulty, going to bed at around 10 p.m.  She awakened to use the bathroom at around 1:45 a.m., and noticed a similar discomfort.  She gave it an 8/10.  It increased with exertion, and she also described an anterior chest burning.  She felt she wanted to burp, but she was unable to. She took extra strength Tylenol, without little improvement.  At around 4 a.m. she noticed lower facial swelling, and had some difficulty swallowing water, but she did not have any breathing problems.  She finally decided to go to the hospital at around 10 a.m.  Her discomfort was that of a 5, and it had been persistent over the last nine hours.  She has never felt this before, and it does not resemble her prior hospitalization with pancreatitis.  She denies any exertional limitations, chest discomfort, orthopnea, PND, edema, or shortness of breath, palpitations, GI, or GU problems.  PAST MEDICAL HISTORY:  She has a history of pancreatitis in August 2001, non-insulin-dependent diabetes mellitus, hypertension, hyperlipidemia, hypothyroidism, remote peptic ulcer disease.  She does describe a stress  test being performed in September 2001, because she was sent to [facility] by Dr. Mirna Mires for a gallbladder surgery.  (She had already had her gallbladder out.)  She was told that her stress test was fine.  She was transferred here for further evaluation.  At [facility] the electrocardiogram showed a normal sinus rhythm, without any acute changes.  Glucose 156, BUN 17, creatinine 0.7.  Sodium 134, potassium 3.3.  CPK and troponins were negative for a myocardial infarction.  A PT of 22.7, and PT 12.1.  Amylase 41, lipase 19.  H&H 13.4, 38.9, normal indices, platelets 283, wbcs 9.1.  At the time of this dictation fasting lipids and TSH are pending.  HOSPITAL COURSE:  [name] was admitted overnight.  Her enzymes and electrocardiograms were negative for a myocardial infarction, and did not show any changes.  DISPOSITION:  Dr. Gerrit Friends. Rothbart, after reviewing the chart, felt that she could be discharged home, with outpatient followup.  DISCHARGE DIAGNOSES: 1. Atypical prolonged discomfort. 2. Facial swelling, not present at the time of transfer to [facility], of unknown etiology. 3. History as previously.  DISCHARGE MEDICATIONS: 1. Benadryl 25 mg as directed for facial swelling. 2. Protonix 40 mg q.d. 3. Premarin 1.25 mg q.d. 4. Lipitor q.h.s. 5. Guaiphenesin 1200 mg b.i.d. 6. Lopid 600 mg b.i.d. 7. HCTZ q.d. 8. Synthroid 0.075 mg  q.d. 9. GlucoVance q.d.  INSTRUCTIONS:  She is advised to keep on a low-fat, low-salt, low-cholesterol, ADA diet.  She is asked on Monday to call our [place] office, to arrange a follow-up stress Cardiolyte and an appointment with Dr. Dietrich Pates. DD:  08/01/00 TD:  08/01/00 Job: [id number]

## 2010-12-26 NOTE — Group Therapy Note (Signed)
NAMEJASNEET, SCHOBERT                ACCOUNT NO.:  [id number]   MEDICAL RECORD NO.:  [id number]          PATIENT TYPE:  INP   LOCATION:  A216                          FACILITY:  [facility]   PHYSICIAN:  [name], M.D.DATE OF BIRTH:  [birth year]   DATE OF PROCEDURE:  DATE OF DISCHARGE:  01/31/2006                                   PROGRESS NOTE   Ms. Tirey says she feels well.  She has no complaints.  She is not short  of breath.  She is feeling well.  She has no nausea or vomiting.  No  shortness of breath.  She had been up around.  Her leg is mildly swollen.  Her exam shows her temperature is 97, pulse 75, respirations 18, blood sugar  142, blood pressure 148/68.  Her chest is clear.  O2 sat 98% on room air.   ASSESSMENT:  She is doing well.  Her prothrombin time today at 20 with an  INR of 1.7.  I am going to give her another dose of Lovenox today, have her  discharged home, and will have her reevaluated as far as her pro-time in  about 2 days.      [name], [name]  Electronically Signed     ELH/MEDQ  D:  01/31/2006  T:  01/31/2006  Job:  [id number]

## 2010-12-26 NOTE — Group Therapy Note (Signed)
[name] [name], ARON                ACCOUNT NO.:  [id number]   MEDICAL RECORD NO.:  [id number]          PATIENT TYPE:  INP   LOCATION:  A216                          FACILITY:  [facility]   PHYSICIAN:  [name], M.D.DATE OF BIRTH:  25-Apr-1932   DATE OF PROCEDURE:  DATE OF DISCHARGE:                                   PROGRESS NOTE   PROBLEM:  Pulmonary emboli.   SUBJECTIVE:  Mrs. Shearer says she is better.  She has no new complaints.  Her leg is not swelling.  She is not as short of breath and not as fatigued.   Her physical examination today shows her temp is 97.9, pulse 84,  respirations 19, blood sugar 189, blood pressure 149/61.  O2 sat 95%.  Her  leg looks better.  Her chest is very clear.  She has less edema of her leg.  Her heart is regular.   ASSESSMENT:  She is improving.  Her proTime is not quite to therapeutic  level.  I would still like her to get 5 full days of Lovenox, and she will  receive that early on the morning of 01/31/2006, and then I will probably  send her home.      [name], [name]  Electronically Signed     ELH/MEDQ  D:  01/29/2006  T:  01/29/2006  Job:  [id number]

## 2010-12-26 NOTE — Discharge Summary (Signed)
[name], [name] [name]                ACCOUNT NO.:  [id number]   MEDICAL RECORD NO.:  [id number]          PATIENT TYPE:  INP   LOCATION:  A216                          FACILITY:  [facility]   PHYSICIAN:  [name], M.D.DATE OF BIRTH:  [birth year]   DATE OF ADMISSION:  01/26/2006  DATE OF DISCHARGE:  06/24/2007LH                                 DISCHARGE SUMMARY   FINAL DISCHARGE DIAGNOSIS:  1.  Acute pulmonary embolism.  2.  Diabetes.  3.  Deep venous thrombosis of the right leg.  4.  Status post surgery for pancreatic pseudocyst.  5.  Hypertension.  6.  Hyperlipidemia.  7.  Hypothyroidism.   HISTORY:  [name] is a 77 year old who has been having shortness of  breath for about a month.  She has been, off and on, getting progressively  worse with time.  This morning she got much worse, called my office, because  of that and chest discomfort.  I had come to the emergency room.  In the  emergency room she had a workup that culminated in a diagnosis of multiple  pulmonary emboli by CT scanning.  She has not had any previous history of  blood clots.  She did have surgery, done in January of this year, for a  pancreatic pseudocyst.  No other injuries.  She has hypertension, diabetes,  hyperlipidemia, and hypothyroidism as well.   She takes:  1.  Lantus 30 units every evening.  2.  Synthroid 200 mcg once a day.  3.  Tricor 145 mg daily.  4.  Amaryl once a day.  5.  Hypokalemia.   Exam shows a well-developed, well-nourished female who was mildly dyspneic  at rest.  Blood pressure 110/74, pulse 80, respirations 18.  Pupils were  reactive to light and accommodation.  Nose and throat clear.  Her neck was  supple without masses.  Her chest was fairly clear.  Her heart was regular  without murmur, gallop or rub.  White count 9800, potassium was 3.3.   HOSPITAL COURSE:  She was started on Lovenox and Coumadin, and improved  greatly.  By the time of discharge her INR was 1.7.  She got  one more dose  of Lovenox before discharge.  She is going to have an INR  tomorrow, in 48 hours.  She is going to be discharged home on her regular  medications as mentioned above plus 7.5 mg of Coumadin daily.  She wants to  follow in the [facility] where her husband is followed because  he is on chronic Coumadin therapy and I will try to arrange that.      [name], [name]  Electronically Signed     ELH/MEDQ  D:  01/31/2006  T:  01/31/2006  Job:  [id number]

## 2010-12-26 NOTE — Op Note (Signed)
NAMENANCI, LAKATOS                ACCOUNT NO.:  [id number]   MEDICAL RECORD NO.:  [id number]          PATIENT TYPE:  INP   LOCATION:  1006                         FACILITY:  [facility]   PHYSICIAN:  Wilmon Arms. Corliss Skains, [name] DATE OF BIRTH:  [birth year]   DATE OF PROCEDURE:  09/01/2005  DATE OF DISCHARGE:                                 OPERATIVE REPORT   PREOPERATIVE DIAGNOSES:  1.  Pancreatic pseudocyst.  2.  Right upper quadrant pain.   POSTOPERATIVE DIAGNOSES:  Resolving pancreatic pseudocyst.   PROCEDURE:  Exploratory laparotomy with lysis of adhesions.   SURGEON:  Wilmon Arms. Corliss Skains, [name]   ASSISTANT:  Thornton Park. Daphine Deutscher, MD   ANESTHESIA:  General endotracheal.   INDICATIONS:  The patient is a 77 year old female with a history of several  attacks of pancreatitis which resulted in a pseudocyst. This was felt to be  due to hypertriglyceridemia. The patient has also had previous exploratory  laparotomy, hysterectomy as well as laparoscopic cholecystectomy. She  underwent an attempted aspiration of the cyst under endoscopic ultrasound  guidance by Dr. Christella Hartigan who was unsuccessful in aspirating any fluid. She was  then referred for further evaluation. The decision was made to proceed with  exploratory laparotomy with the plan of performing a Roux-en-Y cyst  jejunostomy. The patient last underwent a CT scan in December 2006.   DESCRIPTION OF PROCEDURE:  . The patient was brought to the operating room  and placed in supine position on the operating table. After an adequate  level of general endotracheal anesthesia was obtained, the patient had a  Foley catheter placed under sterile technique. Her abdomen was then prepped  with Betadine and draped in a sterile fashion. The patient had a previous  right paramedian incision in her lower abdomen. We began with an upper  midline incision and dissection was carried down through the subcutaneous  tissues using Bovie cautery. The fascia was  opened in the midline. We were  able to enter the peritoneal cavity in the upper midline. There were dense  adhesions to the anterior abdominal wall from the omentum. These were taken  down with cautery. There were multiple dense adhesions in the right upper  quadrant. These were also lysed using cautery and scissors. We performed  approximately 1 hour of adhesiolysis to mobilize the proximal small bowel.  At this point, we  were then able to visualize the stomach and proximal  duodenum. We then identified the ligament of Treitz. We began running the  small bowel distally. We were then able to palpate the area in question as  noted on the CT scan. This appeared to be in the small bowel mesentery. A  small soft area was noted. This was much smaller than the 6 cm size measured  on the CT scan. This measured approximately 3 cm in size. We attempted to  aspirate it with a 22 gauge needle and were able to draw out some old blood.  We opened the mesentery and attempted to dissect around this area. There  were several very large vessels noted overlying the small cystic structure.  The cystic structure appeared to be small and soft. The decision was made to  not pursue a cyst jejunostomy at this time. The cyst appears to be resolving  in size and is not calcified or firm. We continued lysis of adhesions and  were able to mobilize the proximal small bowel. The omentum was also freed  up from the anterior abdominal wall. A small serosal tear was noted in the  small bowel and this was repaired with two interrupted 3-0 silk sutures. The  fascia was then closed after obtaining  an accurate sponge count. We used #1 PDS suture to close the fascia in  running fashion. Staples were used to close the skin. A dry dressing was  applied and the patient was extubated and brought to the recovery room in  stable condition. All sponge, instrument and needle counts were correct.      Wilmon Arms. Tsuei, [name]   Electronically Signed     MKT/MEDQ  D:  09/01/2005  T:  09/02/2005  Job:  [id number]   cc:   Rachael Fee, [name]

## 2010-12-26 NOTE — H&P (Signed)
NAME:  [name] [name], [name] [name]                          ACCOUNT NO.:  [id number]   MEDICAL RECORD NO.:  [id number]                   PATIENT TYPE:   LOCATION:                                       FACILITY:  [facility]   PHYSICIAN:  [name] [name], [name]                 DATE OF BIRTH:  [birth year]   DATE OF ADMISSION:  10/02/2002  DATE OF DISCHARGE:                                HISTORY & PHYSICAL   PRESENTING COMPLAINT:  Recurrent epigastric pain, history of pancreatitis  with pseudocyst.   HISTORY OF PRESENT ILLNESS:  The patient is a 77 year old Caucasian female  patient of [name] [name], [name] who is here for scheduled visit.  She was  initially seen by me in September last year for abdominal pain and abnormal  CT showing a large pseudo cyst in right middle lower abdomen as well as  areas of abnormality in the liver.  She has history of hyperlipidemia.  It  is felt that her prior episodes of pancreatitis might have been secondary to  it.  Further evaluation included normal LFTs, CBC, and amylase.  She had EUS  by Dr. Lanell [name] of [facility] on May 01, 2002.  Her EGD was of normal  caliber.  She had 1.5 mm abnormality in the bile duct without significant  shadowing.  There was concern that this could be microlithiasis.  There were  no abnormalities noted to the pancreas.  We felt sphincterotomy would be the  next step.  She underwent CT guided biopsy of the liver lesion which showed  steatosis.  There was no evidence of tumor.  I saw her back in October 2003.  She was doing fairly well at that time and wanted to just wait and see.   She states she is feeling about the same.  She states lately she has had  epigastric pain mostly every day.  The pain lasts anywhere from three to  four minutes to seven minutes and associated with nausea.  She also  complains of pain below her right rib cage and right lower quadrant of  abdomen when she stoops over or bends.  Pain across her upper abdomen is  generally mild, but at times more intense.  She denies vomiting, fever,  chills, melena, rectal bleeding, or diarrhea.  She also denies dysuria or  hematuria.  Her appetite is normal.  She states she is on an essentially  bland diet.  She has gained about 6 pounds in the last four months.   MEDICATIONS:  1. She is on Premarin 1.25 mg daily.  2. Lantus 13 units daily.  3. Amaryl 4 mg daily.  4. Glucovance 5/500 daily.  5. Tricor 160 mg daily.  6. Moduretic 5/50 daily.   PAST MEDICAL HISTORY:  1. Medical problems include hypertension.  2. Diabetes mellitus.  3. Hypertriglyceridemia.  At one point her triglyceride levels were over  1700 and cholesterol was 2800.  4. History of pancreatitis.  She has had at least two episodes of     pancreatitis.  She has a large pseudo cyst which is extrapancreatic 9 cm     in maximal diameter.  5. She has had hysterectomy.  6. Laparotomy with resection of small bowel for small bowel obstruction     years ago.  7. She had laparoscopic cholecystectomy about three and a half years ago.   ALLERGIES:  NK.   PHYSICAL EXAMINATION:  VITAL SIGNS:  ______ weight 169 pounds.  She is 5  feet 3.5 inches tall.  Pulse 82 per minute, blood pressure 130/70.  She is  afebrile.  HEENT:  Conjunctiva is pink.  Sclera is nonicteric.  Oropharyngeal mucosa is  normal.  NECK:  Without masses or thyromegaly.  HEART, LUNGS:  Within normal limits.  ABDOMEN:  Symmetrical.  Bowel sounds are normal.  Palpation reveals mild to  moderate tenderness mid epigastrium along with mild tenderness below the  right costal margin and right lower quadrant.  She has some _______ in right  mid abdomen which is much less pronounced than her last evaluation.  RECTAL:  Deferred.  EXTREMITIES:  No clubbing or edema noted.   LABORATORIES:  August 24, 2002:  Bilirubin 0.4, AP 114, AST 19, ALT 16,  albumin 3.3.   She had abdominopelvic CT on September 18, 2002.  The pseudo cyst in right  mid  abdomen measures 7.4 x 4.5 cm which has decreased since the previous CT.  Area of patchy enhancement irregularity in the liver has resolved and a  focal area of low attenuation in the left lobe has decreased in size.  Peripheral calcifications noted in the peripancreatic region.  These are  also seen on prior study.   ASSESSMENT:  The patient has history of chronic pancreatitis possibly due to  hyperlipidemia complicated by large pseudocyst outside of pancreas.  On  current CT it appears to have gotten smaller in size.  She has epigastric  pain virtually every day.  This could be due to chronic pancreatitis not  picked up on ultrasonography.  If indeed she has microlithiasis she is at  risk for ongoing pancreatitis and therefore would benefit from  sphincterotomy.  We also need to make sure she does not have peptic ulcer  disease.   RECOMMENDATIONS:  1. I have asked her to take Prilosec 20 mg q.a.m.  2. Diagnostic esophagogastroduodenoscopy primarily looking for peptic ulcer     disease.  3. Meanwhile, will try her on Ultrase MT 12 two at each meal and one with     snack.  If EGD is normal and she does not improve with pancreatic enzyme     supplement, will offer ERCP with endoscopic sphincterotomy.                                               [name] [name], [name]    NR/MEDQ  D:  10/02/2002  T:  10/02/2002  Job:  [id number]   cc:   Ramon Dredge L. Juanetta [name], [name]  [address]  [place]  [place] [address]  Fax: [phone]

## 2010-12-26 NOTE — Op Note (Signed)
[name] [name], [name] [name]                ACCOUNT NO.:  [id number]   MEDICAL RECORD NO.:  [id number]          PATIENT TYPE:  AMB   LOCATION:  DSC                          FACILITY:  [facility]   PHYSICIAN:  Leonides Grills, [name]     DATE OF BIRTH:  09/30/1931   DATE OF PROCEDURE:  06/18/2004  DATE OF DISCHARGE:                                 OPERATIVE REPORT   PREOPERATIVE DIAGNOSIS:  Right ankle lipoma.   POSTOPERATIVE DIAGNOSIS:  Right ankle lipoma.   OPERATION:  Excision of right ankle lipoma, excision benign deep soft tissue  lesion of ankle.   ANESTHESIA:  General endotracheal tube.   SURGEON:  Leonides Grills, [name]   ASSISTANT:  Lianne Cure, P.A.   ESTIMATED BLOOD LOSS:  Minimal.   TOURNIQUET TIME:  None.   COMPLICATIONS:  None.   DISPOSITION:  Stable to recovery room.   INDICATIONS FOR PROCEDURE:  This is a 77 year old female who has had  longstanding anterolateral ankle pain.  MRI was consistent with a lipoma.  She was consented for the above procedure.  All risks which include  infection, neurovascular injury, recurrence, persistent pain, worsening of  pain, hematoma formation with possible neuroma formation were all explained,  questions answered.   OPERATION:  The patient was brought to the operating room and placed in the  supine position after adequate general endotracheal anesthesia was  administered as well as Ancef 1 gram intravenously.  The patient was placed  in the lateral position with the operative site up.  The right lower  extremity was then prepped and draped in a sterile manner with proximal  thigh tourniquet.  A curvilinear incision was then made over the lesion.  Dissection was carried down through the skin.  Hemostasis was obtained.  The  lesion was then meticulously and carefully dissected out in its entirety and  sent to pathology.  The area was copiously irrigated with normal saline.  Hemostasis was  obtained throughout the procedure.  We did not  elevate the tourniquet.  The  subcutaneous tissues was closed with 3-0 Vicryl.  The skin was closed with 4-  0 nylon.  A compression dressing was applied.  Cam walker boot was applied.  The patient was stable and taken to the recovery room.       PB/MEDQ  D:  06/18/2004  T:  06/18/2004  Job:  [id number]

## 2010-12-26 NOTE — Op Note (Signed)
   NAME:  [name] [name], [name] [name]                          ACCOUNT NO.:  [id number]   MEDICAL RECORD NO.:  [id number]                   PATIENT TYPE:  AMB   LOCATION:  DAY                                  FACILITY:  [facility]   PHYSICIAN:  Lionel December, [name]                 DATE OF BIRTH:  Jul 14, 1932   DATE OF PROCEDURE:  10/20/2002  DATE OF DISCHARGE:                                 OPERATIVE REPORT   PROCEDURE:  Esophagogastroduodenoscopy.   INDICATIONS FOR PROCEDURE:  The patient is a 77 year old Caucasian female  with history of pancreatitis complicated by a pseudocyst which is outside  the pancreas. She continues to have epigastric right upper quadrant pain.  She is undergoing diagnostic EGD.  She has been treated with PPI but has  failed to respond.  The procedure was reviewed with the patient, and  informed consent was obtained.   PREOPERATIVE MEDICATIONS:  Cetacaine spray for pharyngeal topical  anesthesia, Demerol 50 mg IV, Versed 6 mg IV in divided doses.   INSTRUMENT USED:  The Olympus video system.   FINDINGS:  The procedure was performed in the endoscopy suite.  The  patient's vital signs and O2 saturations were monitored during the procedure  and remained stable.  The patient was placed in the left lateral position,  and the endoscope was passed via the oropharynx without any difficulty into  the esophagus.   Esophagus:  The mucosa of the proximal and middle third was normal.  Distally, she had four small erosions.  There was focal erythema at the  esophagogastric junction, but no hernia was identified.   Stomach:  It was empty and distended very well with insufflation.  The folds  of the proximal stomach were normal.  Examination of the mucosa, body, and  pyloric channel as well as angularis, fundus, and cardia was normal.   Duodenum:  Examination of the bulb and second part of the duodenum was  normal.  The endoscope was withdrawn.  The patient tolerated the procedure  well.   FINAL DIAGNOSIS:  Erosive reflux esophagitis which would not explain the  patient's symptomatology.    PLAN:  1. Will check her LFTs today since she has had a lot of pain in the last     couple of days.  2. ERCP with sphincterotomy, given the findings of prior EUS.                                               Lionel December, [name]    NR/MEDQ  D:  10/20/2002  T:  10/20/2002  Job:  [id number]   cc:   Ramon Dredge L. Juanetta Gosling, [name]  [address]  [place]  [place] [address]  Fax: [phone]

## 2010-12-26 NOTE — H&P (Signed)
[name] [name], SIME                ACCOUNT NO.:  [id number]   MEDICAL RECORD NO.:  [id number]          PATIENT TYPE:  INP   LOCATION:  A216                          FACILITY:  [facility]   PHYSICIAN:  [name], M.D.DATE OF BIRTH:  [birth year]   DATE OF ADMISSION:  01/26/2006  DATE OF DISCHARGE:  LH                                HISTORY & PHYSICAL   PROBLEM:  Pulmonary embolus.   HISTORY:  [name] [name] is a 77 year old who says that she has been having  shortness of breath for about a month.  Its been off and on, and has been  getting progressively worse.  She says that this morning she got much worse,  called my office, and I asked her to come to the emergency room.  When she  came to the emergency room she was complaining of chest tightness, shortness  of breath, and difficulty with ambulation, and because of all these symptoms  she was worked up, with the workup eventually culminating in a diagnosis of  multiple pulmonary emboli.  She has no previous history of any sort of blood  clots. She has not had any previous history of any other lung problems as  far she knows.  She says that she has not well felt quite right since  surgery that was done in January of this year for a pancreatic pseudocyst.  She has a history of hypertension, diabetes, hyperlipidemia, and  hypothyroidism.   SOCIAL HISTORY:  She lives at home with her husband.  She does not smoke.  She does not drink any alcohol. She does not use any other drugs.   FAMILY HISTORY:  Her father died of heart disease, her mother of cancer.  No  known history of any sort of problems with clots.   MEDICATIONS AT HOME:  1.  Lantus 30 units every evening.  2 .  Synthroid 200 mcg once a day.  1.  Tricor 145 mg once a day.  2.  Amaryl, I am not sure that dose, once a day.   PHYSICAL EXAMINATION:  GENERAL: Her physical examination shows a well-  developed, well-nourished female who appears to be mildly dyspneic.  VITAL SIGNS:  Her blood pressure 110/74, pulse 80, respirations are 18.  HEENT: Her pupils are reactive to light and accommodation.  Nose and throat  are clear.  NECK: Supple.  No masses felt.  She does not have any bruits.  I am cannot  feel her thyroid  CHEST: Pretty clear.  HEART: Regular.  Does not have any rales, rhonchi, wheezes, or rubs.  Her  heart is regular without gallop.  ABDOMEN: Soft.  No masses are felt.  Bowel sounds are present.  She does not  have any organs palpable.  NEUROLOGIC:  She is intact grossly.   LABORATORY:  CBC showed white count 9800, hemoglobin 14.7, platelets  187,000.  Electrolytes show a potassium is 3.3 otherwise normal. Glucose  241.  Cardiac markers negative for acute event. Blood gas reveal pH 7.48,  pCO2 of 32, pO2 of 59. Second cardiac marker also negative.  D-dimer was 3.5  and BNP was less than 30.  His CT shows pulmonary emboli.   ASSESSMENT:  She has pulmonary emboli.  She is diabetic and she is mildly  hypokalemic.   PLAN:  Put on Lovenox, Coumadin, continue with all the other treatments, and  follow.      [name], [name]  Electronically Signed     ELH/MEDQ  D:  01/26/2006  T:  01/26/2006  Job:  [id number]

## 2010-12-26 NOTE — Assessment & Plan Note (Signed)
[facility]                         [place] CARDIOLOGY OFFICE NOTE   TARALYNN, QUIETT                       MRN:          [id number]  DATE:05/26/2006                            DOB:          05-Jan-1932    [name] returns to the office today for followup.  She was admitted to  [facility] on January 26, 2006 with about a month's history of  shortness of breath.  She had multiple pulmonary emboli by CT scans.   She had had a pancreatic pseudocyst removed from recurrent pancreatitis in  January of 2007.  She has been on Coumadin, followed in the anticoagulation  clinic.  Her INR today is therapeutic at 3.0.   She continues to have progressive problems with shortness of breath.  In  addition, she has had some chest discomfort in the precordium along with  some shortness of breath.  It does not sound pleuritic.   She has coronary risk factors, including age, hypertension for approximately  10 years.  Diabetes for 7 years, which is not well controlled.  Mixed  hyperlipidemia.  Her lipids look remarkably good on TriCor March 04, 2006.  Her triglycerides were still elevated at 250, however.   CURRENT MEDICATIONS:  1. Premarin 1.25 mg q. day.  2. Amiloride/hydrochlorothiazide 25/50 q. day.  3. Glucophage/Glucotrol unknown dose q. day.  4. Insulin Lantus 30 units q. day.  5. Synthroid 200 mcg a day.  6. Amaryl 4 mg a day.  7. TriCor 145 mg a day.  8. Coumadin as directed.   She recently was evaluated for back problems.  She saw Dr. Ethelene Hal at  [facility] in [place].  He was wondering about stopping  her Coumadin and doing either injections or back surgery.  We do not feel it  is safe to stop her Coumadin.   EXAM:  Blood pressure today is 140/80, her pulse is 90 and regular.  Her  weight is 181.  She is slightly pale.  She is in no acute distress.  SKIN:  Warm and dry.  HEENT:  She wears glasses.  PERRLA.   Extraocular muscles are intact.  Sclerae clear.  Facial symmetry is normal.  Dentition is satisfactory.  Carotid upstrokes are equal bilaterally without bruits.  There is no JVD.  Thyroid is not enlarged.  Trachea is midline.  LUNGS:  Clear to auscultation and percussion with no rubs.  Her PMI is nondisplaced.  She has a normal S1 and S2 without murmur, rub, or  gallop.  ABDOMEN:  Soft with good bowel sounds.  There is no midline bruit.  There is  no hepatomegaly.  She has had previous incisions.  EXTREMITIES:  Puffy lower extremities but only 1+ pitting edema.  I could  feel no venous cords.  Pulses were diminished, but present.  NEURO:  Intact.   ASSESSMENT AND PLAN:  I have had a long talk with Mrs. Baumgardner.  I am  concerned about her potentially throwing more pulmonary emboli despite being  on anticoagulation.  In addition, she is at a higher risk of having  significant coronary disease with her multiple risk factors including  diabetes.   PLAN:  1. Continue current medications.  2. Tighten blood sugar control.  3. CT with contrast to rule out further pulmonary emboli.  Hope she has      had some regression of this since June.  4. Adenosine Myoview.   I will plan on seeing her back in the office next Thursday the 25th.  We  will discuss the findings of the studies.   I would not recommend surgery on her back, or injections, anything  necessitating Coumadin being stopped at this time.       Thomas C. Daleen Squibb, MD, [facility]      TCW/MedQ  DD:  05/26/2006  DT:  05/27/2006  Job #:  [id number]   cc:   Ramon Dredge L. Juanetta Gosling, [name]  Richard D. Ethelene Hal, [name]

## 2010-12-26 NOTE — Assessment & Plan Note (Signed)
[facility]                         [place] CARDIOLOGY OFFICE NOTE   ADONNA, HORSLEY                       MRN:          [id number]  DATE:06/03/2006                            DOB:          [birth year]    Ms. Haye returns today to followup on our office visit dated May 26, 2006.   Chest CT showed complete resolution of her previous multiple pulmonary  emboli with no new pulmonary emboli.  Stress Myoview showed an EF of 65%  with no ischemia or scar.   Other than a bruise from a tourniquet on her right upper extremity from the  procedure, she has no complaints.  Her Coumadin is therapeutic.   Her medications are unchanged.   Her blood pressure today is 158/76, it is generally better than this.  Her  pulse is 76 and regular.  Weight is 179.  She has no other changes on  physical exam except the bruise on her right upper extremity.   ASSESSMENT AND PLAN:  I am delighted that Mrs. Stader's pulmonary emboli  have resolved.  Assuming that the inciting event with surgery for her  pancreatic pseudocyst in January, she needs to at least stay on it another 3  months.  I will see her back on December 26 or 28 when I return to  [place].  At that time if she is doing well, we will discontinue it.   I am also delighted that her stress Myoview was negative for any ischemia.   These results were shared with her and her husband.     Thomas C. Daleen Squibb, MD, [facility]    TCW/MedQ  DD: 06/03/2006  DT: 06/04/2006  Job #: [id number]   cc:   Ramon Dredge L. Juanetta Gosling, [name]

## 2010-12-26 NOTE — Discharge Summary (Signed)
[name], [name] [name]                ACCOUNT NO.:  [id number]   MEDICAL RECORD NO.:  [id number]          PATIENT TYPE:  INP   LOCATION:  1618                         FACILITY:  [facility]   PHYSICIAN:  Wilmon Arms. Corliss Skains, [name] DATE OF BIRTH:  1931/09/30   DATE OF ADMISSION:  09/01/2005  DATE OF DISCHARGE:  09/07/2005                                 DISCHARGE SUMMARY   ADMISSION DIAGNOSES:  1.  Right upper quadrant pain.  2.  Pancreatic pseudocyst.   DISCHARGE DIAGNOSES:  1.  Right upper quadrant pain.  2.  Pancreatic pseudocyst.   PROCEDURE:  Exploratory laparotomy with lysis of adhesions.   BRIEF HISTORY:  [name] is a 77 year old female who has a past medical  history of several episodes of pancreatitis. She has been doing well over  the last couple of years but has developed some right upper quadrant pain.  This was felt to be due to a pancreatic pseudocyst. Attempts were made to  drain the pseudocyst endoscopically but these were unsuccessful. The cyst  did appear to be getting smaller on serial CT scan. However, due to her  pain, she was referred for surgical drainage. She was admitted to the  hospital on September 01, 2005 and underwent exploratory laparotomy. The  patient had significant adhesions especially on the right side. After  meticulous lysis of adhesions, we were able to locate the site of the  pseudocyst. This appeared much smaller than even the last CT scan 2 months  ago. The cyst was felt to be too small to safely create an anastomosis to  the small bowel. There the cyst was aspirated with a needle and we completed  the laparotomy and closed. The patient slowly regained bowel function over  the next 3 days. After she regained bowel function, she developed fairly  loose diarrhea. Clostridium difficile toxin was checked and was negative.  The patient also had some persistent nausea although she was having bowel  movements. This has since resolved. On postoperative day  #5, she was  advanced to a regular diet. She is being discharged on postoperative day #6.  She is on a regular diet, her bowel movements have slowed down and are more  firm. She is ambulating without difficulty. She is not taking any pain  medication. Her incision looks good. She is being discharged home and will  followup on Thursday with Dr. Corliss Skains for staple removal. She has been given a  prescription for Percocet for pain. She should resume all of her other  medications.      Wilmon Arms. Tsuei, [name]  Electronically Signed     MKT/MEDQ  D:  09/07/2005  T:  09/08/2005  Job:  [id number]   cc:   Rachael Fee, [name]

## 2010-12-26 NOTE — Group Therapy Note (Signed)
[name] [name], HITZ                ACCOUNT NO.:  [id number]   MEDICAL RECORD NO.:  [id number]          PATIENT TYPE:  INP   LOCATION:  A216                          FACILITY:  [facility]   PHYSICIAN:  [name], M.D.DATE OF BIRTH:  1931-12-25   DATE OF PROCEDURE:  DATE OF DISCHARGE:                                   PROGRESS NOTE   Ms. Depasquale says she is feeling better and has improved greatly as far as  her breathing is concerned.  Her blood sugar has also been better.  She is  not having any new complaints and says her shortness of breath is much  improved.   Her physical examination today shows temperature is 96.9, pulse is 75,  respirations 20, blood sugar 141, blood pressure 144/67.  Her chest is very  clear.  Her heart is regular.  Her abdomen is soft.  Extremities show no  edema.   Her white count is 7200, hemoglobin 12.7, platelets 238.  INR 1.6 with a  prothrombin time of 19.1.   ASSESSMENT:  She seems better.   PLAN:  I am hopeful that she will be able to be discharged home tomorrow,  that will finish five days of Lovenox.  That will of course depend on  whether she is fully anticoagulated.  She does of course have diabetes which  is better as well.      [name], [name]  Electronically Signed     ELH/MEDQ  D:  01/30/2006  T:  01/30/2006  Job:  [id number]

## 2010-12-26 NOTE — Group Therapy Note (Signed)
[name], [name]                ACCOUNT NO.:  [id number]   MEDICAL RECORD NO.:  [id number]          PATIENT TYPE:  INP   LOCATION:  A216                          FACILITY:  [facility]   PHYSICIAN:  [name], M.D.DATE OF BIRTH:  February 28, 1932   DATE OF PROCEDURE:  01/27/2006  DATE OF DISCHARGE:                                   PROGRESS NOTE   PROBLEM:  Pulmonary embolus.   SUBJECTIVE:  [name] [name] says she is much better this morning.  She wants to  go home.  I have told that it is okay for her to get up but that there is  significant concern about that she needs at least five days of Lovenox.  There is a potential that we could do that as an outpatient and I will see  what we can arrange.   PHYSICAL EXAMINATION:  GENERAL:  She is awake and alert.  VITAL SIGNS:  Temperature is 97.9, pulse 85, respirations 16, blood sugar  this morning 164, but last night it was 418, blood pressure 130/64, O2  saturation 95% on 2 L.  She does look much more comfortable.  CHEST:  Clearer.  HEART:  Regular.  ABDOMEN:  Soft.   Prothrombin time this morning 12.5 with an INR of 0.9 so she is clearly not  anticoagulated with Coumadin yet.   PLAN:  Get her up and moving.  See what we can do with making arrangements  for her to potentially have this done as an outpatient.  I would like to see  what she does when she is up before we make that decision.  Otherwise, will  continue treatments and follow.  Her blood sugar is not controlled, but will  wait and see how she does today with a little less stress.      [name], [name]  Electronically Signed     ELH/MEDQ  D:  01/27/2006  T:  01/27/2006  Job:  [id number]

## 2010-12-26 NOTE — H&P (Signed)
[facility]  Patient:    [name] [name], [name] [name] Visit Number: [id number] MRN: [id number]          Service Type: MED Location: 3A [id number] Attending Physician:  Cassell Smiles. Dictated by:   Leonia Reeves, [name] Admit Date:  01/26/2002                           History and Physical  TIME OF ADMISSION:  2:30 a.m.  CHIEF COMPLAINT:  Nausea, vomiting, abdominal pain, and diarrhea.  HISTORY OF PRESENT ILLNESS:  The patient is a 77 year old white female with medical history of diabetes mellitus type 2, hypothyroidism, hypertension, status post total abdominal hysterectomy and bilateral salpingo-oophorectomy, status post cholecystectomy, status post colon resection.  The patient came to the ER by 2 p.m. on January 26, 2002, with a history of nausea, vomiting, and profuse diarrhea associated with generalized abdominal pain.  At that time she was seen by emergency doctor who felt the patient had viral gastroenteritis. She was started on IV rehydration, IV Demerol, and Phenergan.  She appeared to have improved before she was discharged home for a follow-up but, later on, problem started again, and the patient said she became worse.  She had had several bowel movements of watery diarrhea with no blood associated.  Vomiting also was not associated with blood.  The patient denied fever and chills.  She also denied chest pain, shortness of breath, and palpitations.  PAST MEDICAL HISTORY: 1. Diabetes mellitus type 2. 2. Hypothyroidism. 3. Hypertension. 4. Hyperlipidemia. 5. Status post total abdominal hysterectomy and bilateral    salpingo-oophorectomy at the age of 37. 6. Cholecystectomy and colon resection about 30 years ago.  She denied history of coronary artery disease, history of stroke, and history of congestive heart failure.  ALLERGIES:  No known drug allergies.  SOCIAL HISTORY:  The patient denied alcohol and tobacco abuse.  She lives with her family  and enjoys family support.  In this visit she was accompanied by her husband.  CURRENT MEDICATIONS: 1. Glucophage 500 mg p.o. q.a.m. 2. Insulin Lantus 13 units subcutaneous bedtime. 3. Tricor 160 mg p.o. q.d. 4. Premarin 1.2 mg. 5. Synthroid. 6. Hydrochlorothiazide.  REVIEW OF SYSTEMS:  Positive for nausea, vomiting, abdominal pain, and diarrhea.  No chest pain.  No dystonia.  No leg edema.  PHYSICAL EXAMINATION:  GENERAL:  The patient is an elderly, pleasant white woman in no apparent distress.  Alert and oriented x3.  VITAL SIGNS:  Blood pressure 144/68, pulse 95, respiratory rate 24, temperature 98.  HEENT:  Head is normocephalic, atraumatic.  Pupils are equal, round, and reactive to light and accommodation.  Sclerae are nonicteric.  NECK:  Supple.  Without adenopathy.  No jugular venous distention.  No carotid bruit.  No thyromegaly.  Mucous membranes are moist.  No oropharyngeal lesion.  CARDIOVASCULAR:  S1 and S2 are normal.  No S3, no S4, and no murmur appreciated.  LUNGS:  Clear to auscultation and percussion.  There is good air entry bilaterally.  GI:  The abdomen is mildly obese, with mild generalized tenderness.  No palpable mass, no organomegaly.  Bowel sounds are hyperactive.  EXTREMITIES:  No edema.  No cyanosis.  No distal clubbing.  NEUROLOGIC:  No focal neurologic deficits.  Cranial nerves II-XII are grossly intact.  LABORATORY DATA:  Done during the patients first ER visit:  CT of the head septal low attenuation left external capsule, questionable stroke.  To  follow up with MRI.  No hemorrhage.  Urinalysis shows urinary tract infection.  CBC:  White count 11, hemoglobin 14.8, hematocrit 34.1, MCV 85.6, platelets mildly depressed at 123. Comprehensive metabolic panel:  Sodium 136, potassium 3.8, chloride 101, carbon dioxide 20, glucose elevated at 312.  BUN 14, creatinine 1.0, calcium 8.6, total protein 6.3, albumin 3, AST 13, ALT 14, alkaline  phosphatase 104, total bilirubin 1.2.  Amylase 70, lipase 12.  ASSESSMENT:  Elderly white woman with history of diabetes mellitus, hypertension, hypothyroidism, and hyperlipidemia, now presenting with nausea and vomiting with profuse diarrhea.  Vital signs are stable.  Glucose elevated at 312.  Urinalysis shows acute urinary tract infection.  CBC essentially within normal limits.  CT scan of the head shows septal low attenuation in the left external capsule, questionable acute stroke.  PLAN:  The patient will be admitted to the medical floor with the following working diagnoses: 1. Acute viral gastroenteritis:  In emergency room, the patient has been    started on rehydration, and this will continue on the medical floor. 2. Acute urinary tract infection:  The patient was started on p.o. Cipro, and    this will also continue on the medical floor. 3. CT scan of the head showing septal low attenuation in the ______ external    capsule, questionable stroke.  The patient will be monitored, and further    evaluation will include MRI of the brain. 4. Uncontrolled diabetes mellitus type 2:  The patient will be started on    Regular Insulin for acute control of the blood sugar and then continued    with her current medication which is GlucoVance for further control and    maintenance. 5. Her current medications will be added as needed. Dictated by:   Leonia Reeves, [name] Attending Physician:  Cassell Smiles DD:  01/27/02 TD:  01/28/02 Job: [id number]

## 2011-02-20 ENCOUNTER — Other Ambulatory Visit: Payer: Self-pay | Admitting: Cardiology

## 2011-03-19 ENCOUNTER — Emergency Department (HOSPITAL_COMMUNITY): Payer: Medicare Other

## 2011-03-19 ENCOUNTER — Ambulatory Visit (INDEPENDENT_AMBULATORY_CARE_PROVIDER_SITE_OTHER): Payer: Medicare Other | Admitting: Cardiovascular Disease

## 2011-03-19 ENCOUNTER — Emergency Department (HOSPITAL_COMMUNITY)
Admission: EM | Admit: 2011-03-19 | Discharge: 2011-03-19 | Disposition: A | Discharge status: Home / Self Care | Payer: Medicare Other | Attending: Emergency Medicine | Admitting: Emergency Medicine

## 2011-03-19 ENCOUNTER — Encounter: Payer: Self-pay | Admitting: Cardiovascular Disease

## 2011-03-19 ENCOUNTER — Ambulatory Visit: Payer: Medicare Other | Admitting: Cardiology

## 2011-03-19 ENCOUNTER — Other Ambulatory Visit: Payer: Self-pay | Admitting: Cardiovascular Disease

## 2011-03-19 ENCOUNTER — Encounter: Payer: Self-pay | Admitting: *Deleted

## 2011-03-19 ENCOUNTER — Other Ambulatory Visit: Payer: Self-pay

## 2011-03-19 VITALS — BP 164/76 | HR 78 | Resp 18 | Ht 63.0 in | Wt 164.4 lb

## 2011-03-19 DIAGNOSIS — E119 Type 2 diabetes mellitus without complications: Secondary | ICD-10-CM | POA: Insufficient documentation

## 2011-03-19 DIAGNOSIS — R0989 Other specified symptoms and signs involving the circulatory and respiratory systems: Secondary | ICD-10-CM

## 2011-03-19 DIAGNOSIS — I2510 Atherosclerotic heart disease of native coronary artery without angina pectoris: Secondary | ICD-10-CM

## 2011-03-19 DIAGNOSIS — E7800 Pure hypercholesterolemia, unspecified: Secondary | ICD-10-CM | POA: Insufficient documentation

## 2011-03-19 DIAGNOSIS — I252 Old myocardial infarction: Secondary | ICD-10-CM | POA: Insufficient documentation

## 2011-03-19 DIAGNOSIS — R079 Chest pain, unspecified: Secondary | ICD-10-CM

## 2011-03-19 DIAGNOSIS — Z794 Long term (current) use of insulin: Secondary | ICD-10-CM | POA: Insufficient documentation

## 2011-03-19 DIAGNOSIS — Z7982 Long term (current) use of aspirin: Secondary | ICD-10-CM | POA: Insufficient documentation

## 2011-03-19 DIAGNOSIS — I10 Essential (primary) hypertension: Secondary | ICD-10-CM | POA: Insufficient documentation

## 2011-03-19 DIAGNOSIS — Z79899 Other long term (current) drug therapy: Secondary | ICD-10-CM | POA: Insufficient documentation

## 2011-03-19 DIAGNOSIS — Z9861 Coronary angioplasty status: Secondary | ICD-10-CM | POA: Insufficient documentation

## 2011-03-19 DIAGNOSIS — E079 Disorder of thyroid, unspecified: Secondary | ICD-10-CM | POA: Insufficient documentation

## 2011-03-19 HISTORY — DX: Disorder of thyroid, unspecified: E07.9

## 2011-03-19 HISTORY — DX: Essential (primary) hypertension: I10

## 2011-03-19 LAB — CARDIAC PANEL(CRET KIN+CKTOT+MB+TROPI)
CK, MB: 4.8 ng/mL — ABNORMAL HIGH (ref 0.3–4.0)
Relative Index: INVALID (ref 0.0–2.5)
Troponin I: <0.3 ng/mL (ref <0.30)

## 2011-03-19 LAB — URINALYSIS, ROUTINE W REFLEX MICROSCOPIC
Bilirubin Urine: NEGATIVE
Glucose, UA: NEGATIVE mg/dL
Ketones, ur: NEGATIVE mg/dL
Leukocytes, UA: NEGATIVE
pH: 5.5 (ref 5.0–8.0)

## 2011-03-19 LAB — DIFFERENTIAL
Lymphs Abs: 1.9 10*3/uL (ref 0.7–4.0)
Monocytes Relative: 7 % (ref 3–12)
Neutro Abs: 12.9 10*3/uL — ABNORMAL HIGH (ref 1.7–7.7)
Neutrophils Relative %: 80 % — ABNORMAL HIGH (ref 43–77)

## 2011-03-19 LAB — BASIC METABOLIC PANEL
GFR calc Af Amer: >60 mL/min (ref >60)
GFR calc non Af Amer: >60 mL/min (ref >60)
Potassium: 3 mEq/L — ABNORMAL LOW (ref 3.5–5.1)
Sodium: 138 mEq/L (ref 135–145)

## 2011-03-19 LAB — CBC
Hemoglobin: 13.7 g/dL (ref 12.0–15.0)
MCH: 29.9 pg (ref 26.0–34.0)
RBC: 4.58 MIL/uL (ref 3.87–5.11)

## 2011-03-19 MED ORDER — POTASSIUM CHLORIDE CRYS ER 20 MEQ PO TBCR
60.0000 meq | EXTENDED_RELEASE_TABLET | Freq: Once | ORAL | Status: AC
  Administered 2011-03-19: 20 meq via ORAL
  Filled 2011-03-19: qty 3

## 2011-03-19 MED ORDER — ALBUTEROL SULFATE (5 MG/ML) 0.5% IN NEBU
2.5000 mg | INHALATION_SOLUTION | Freq: Once | RESPIRATORY_TRACT | Status: AC
  Administered 2011-03-19: 2.5 mg via RESPIRATORY_TRACT
  Filled 2011-03-19: qty 0.5

## 2011-03-19 MED ORDER — ONDANSETRON HCL 4 MG/2ML IJ SOLN
4.0000 mg | Freq: Once | INTRAMUSCULAR | Status: AC
  Administered 2011-03-19: 4 mg via INTRAVENOUS
  Filled 2011-03-19: qty 2

## 2011-03-19 MED ORDER — MORPHINE SULFATE 2 MG/ML IJ SOLN
2.0000 mg | Freq: Once | INTRAMUSCULAR | Status: AC
  Administered 2011-03-19: 2 mg via INTRAVENOUS
  Filled 2011-03-19: qty 1

## 2011-03-19 MED ORDER — LEVOFLOXACIN 500 MG PO TABS: 500.0000 mg | ORAL_TABLET | Freq: Once | ORAL | Status: DC

## 2011-03-19 MED ORDER — LEVOFLOXACIN 500 MG PO TABS: 500.0000 mg | ORAL_TABLET | Freq: Every day | ORAL | Status: AC

## 2011-03-19 MED ORDER — ASPIRIN 81 MG PO CHEW
324.0000 mg | CHEWABLE_TABLET | Freq: Once | ORAL | Status: AC
  Administered 2011-03-19: 324 mg via ORAL
  Filled 2011-03-19: qty 4

## 2011-03-19 MED ORDER — LEVOFLOXACIN 500 MG PO TABS
500.0000 mg | ORAL_TABLET | Freq: Once | ORAL | Status: AC
  Administered 2011-03-19: 500 mg via ORAL
  Filled 2011-03-19: qty 1

## 2011-03-19 NOTE — Progress Notes (Signed)
HPI:  [name] presents as an add on evaluation. The patient has been followed by Dr. Daleen Squibb. She has coronary disease and history of pulmonary embolus. She has been in her normal state of health until last night when she woke up with pain in the center of her back radiating forward through the chest. She described this as a pressure and it was associated with shortness of breath and diaphoresis. The patient was evaluated in the emergency department at any time hospital. She had lab work drawn, and EKG, chest x-ray, and she was watched for several hours. Her pain resolved over a few hours and she has had no further problems.   I have reviewed her lab work and the only pertinent findings were a leukocytosis with left shift. The patient was started on antibiotics. Her cardiac markers were negative. Her chest x-ray showed no acute disease.  The patient has no complaints at this time. She states her chest pain and shortness of breath had completely resolved. She denies fever, chills, or edema. She feels tired but states that she hasn't eaten since breakfast. She had no exertional symptoms of late. She has no other complaints.  Outpatient Encounter Prescriptions as of 03/19/2011  Medication Sig Dispense Refill  . aMILoride (MIDAMOR) 5 MG tablet Take 5 mg by mouth daily.        Marland Kitchen aspirin 81 MG tablet Take 81 mg by mouth daily.        . hydrochlorothiazide 50 MG tablet Take 50 mg by mouth daily.        . insulin aspart (NOVOLOG) 100 UNIT/ML injection Inject 5 Units into the skin 3 (three) times daily before meals.        . insulin glargine (LANTUS) 100 UNIT/ML injection Inject 50 Units into the skin at bedtime.        Marland Kitchen levofloxacin (LEVAQUIN) 500 MG tablet Take 1 tablet (500 mg total) by mouth daily.  7 tablet  0  . levothyroxine (SYNTHROID, LEVOTHROID) 175 MCG tablet Take 175 mcg by mouth daily.        . metoprolol tartrate (LOPRESSOR) 25 MG tablet TAKE ONE TABLET TWICE DAILY  60 tablet  12  .  nitroGLYCERIN (NITROSTAT) 0.4 MG SL tablet Place 0.4 mg under the tongue every 5 (five) minutes as needed.        . rosuvastatin (CRESTOR) 40 MG tablet Take 40 mg by mouth daily.         Facility-Administered Encounter Medications as of 03/19/2011  Medication Dose Route Frequency Provider Last Rate Last Dose  . albuterol (PROVENTIL) (5 MG/ML) 0.5% nebulizer solution 2.5 mg  2.5 mg Nebulization Once [employer]. Colon Branch, MD   2.5 mg at 03/19/11 0512  . aspirin chewable tablet 324 mg  324 mg Oral Once [employer]. Colon Branch, MD   324 mg at 03/19/11 0504  . levofloxacin (LEVAQUIN) tablet 500 mg  500 mg Oral Once [employer]. Colon Branch, MD   500 mg at 03/19/11 0629  . morphine injection 2 mg  2 mg Intravenous Once [employer]. Colon Branch, MD   2 mg at 03/19/11 0509  . ondansetron (ZOFRAN) injection 4 mg  4 mg Intravenous Once [employer]. Colon Branch, MD   4 mg at 03/19/11 0508  . potassium chloride SA (K-DUR,KLOR-CON) CR tablet 60 mEq  60 mEq Oral Once [employer]. Colon Branch, MD   20 mEq at 03/19/11 0545  . DISCONTD: levofloxacin (LEVAQUIN) tablet 500 mg  500 mg Oral Once [employer]. Colon Branch, MD  No Known Allergies  Past Medical History  Diagnosis Date  . Acute MI   . Diabetes mellitus   . Thyroid disease   . Hypertension   . High cholesterol     ROS: Negative except as per HPI  There were no vitals taken for this visit.  PHYSICAL EXAM: Pt is alert and oriented, pleasant woman in NAD HEENT: normal Neck: JVP - normal, carotids 2+= without bruits Lungs: CTA bilaterally CV: RRR without murmur or gallop Abd: soft, NT, Positive BS, no hepatomegaly Ext: no C/C/E, distal pulses intact and equal Skin: warm/dry no rash  EKG:  Normal sinus rhythm 78 beats per minute, within normal limits.  ASSESSMENT AND PLAN:

## 2011-03-19 NOTE — Patient Instructions (Signed)
Your physician recommends that you schedule a follow-up appointment in: 2 weeks with Dr. Daleen Squibb  Non-Cardiac CT scanning, (CAT scanning), is a noninvasive, special x-ray that produces cross-sectional images of the body using x-rays and a computer. CT scans help physicians diagnose and treat medical conditions. For some CT exams, a contrast material is used to enhance visibility in the area of the body being studied. CT scans provide greater clarity and reveal more details than regular x-ray exams. This is scheduled tomorrow at [facility].  Arrive at the hospital at 7:45.  Do not eat or drink for 2 hours prior to the test

## 2011-03-19 NOTE — ED Provider Notes (Signed)
History     CSN: [id number] Arrival date & time: 03/19/2011  2:45 AM  Chief Complaint  Patient presents with  . Chest Pain   HPI Comments: Seen  0340. Patient woke up a short time ago with bed and pillow soaking wet. Had numbness to the left arm and hand, aching in both shoulders and a tight feeling in her chest. She got up, walked the floors for a while, dried off and took a SL ntg for the continued feeling of chest tightness. She denies fever, chills, nausea, vomiting. Patient has a h/o CAD with stents x 2 placed 3 years ago. She is followed by Dr. Daleen Squibb, cardiology  with whom she has an appointment 04/21/2011. She a regular appointment with her PMD, Dr. Juanetta Gosling tomorrow at 2 PM. Walking did not make the pain worse. Here is nothing  she has done that improved  improve the pain. Since arrival in the ER pain has subsided on its own without intervention.  Patient is a 77 y.o. female presenting with chest pain. The history is provided by the patient.  Chest Pain The chest pain began 3 - 5 hours ago. Chest pain occurs intermittently. The chest pain is improving. At its most intense, the pain is at 5/10. The pain is currently at 3/10. The severity of the pain is mild. The quality of the pain is described as aching (chest feels tight). The pain does not radiate. Pertinent negatives for primary symptoms include no shortness of breath. She tried nothing for the symptoms.  Procedure history is positive for cardiac catheterization. Procedure history comments: stents x 2.     Past Medical History  Diagnosis Date  . Acute MI   . Diabetes mellitus   . Thyroid disease   . Hypertension   . High cholesterol     Past Surgical History  Procedure Date  . Coronary angioplasty with stent placement   . Abdominal hysterectomy   . Cholecystectomy   . Tonsillectomy   . Appendectomy     History reviewed. No pertinent family history.  History  Substance Use Topics  . Smoking status: Never Smoker   .  Smokeless tobacco: Not on file  . Alcohol Use: No    OB History    Grav Para Term Preterm Abortions TAB SAB Ect Mult Living                  Review of Systems  Respiratory: Negative for shortness of breath.   Cardiovascular: Positive for chest pain.  All other systems reviewed and are negative.    Physical Exam  BP 150/80  Pulse 82  Temp(Src) 97.5 F (36.4 C) (Oral)  Resp 14  Ht 5' 3.5" (1.613 m)  Wt 150 lb (68.04 kg)  BMI 26.15 kg/m2  SpO2 97%  Physical Exam  Constitutional: She is oriented to person, place, and time. She appears well-developed and well-nourished.  HENT:  Head: Normocephalic and atraumatic.  Eyes: EOM are normal.  Neck: Normal range of motion. Neck supple.  Cardiovascular: Normal rate, normal heart sounds and intact distal pulses.   Pulmonary/Chest: Effort normal and breath sounds normal. No respiratory distress. She has no wheezes. She has no rales. She exhibits no tenderness.  Abdominal: Soft. Bowel sounds are normal.  Musculoskeletal: Normal range of motion.  Neurological: She is alert and oriented to person, place, and time.  Skin: Skin is warm and dry.    ED Course  Procedures  MDM  Date: 03/19/2011  0247  Rate:105  Rhythm: sinus tachycardia  QRS Axis: normal  Intervals: normal  ST/T Wave abnormalities: nonspecific ST/T changes  Conduction Disutrbances:none  Narrative Interpretation:   Old EKG Reviewed: unchanged since 03/11/2011  Patient with chest discomfort, sweating and elevated WBC c/w beginning URI. Chest xray negative. Cardiac marker with elevated CKMB however normal CK and troponin. Patient was given albuterol treatment while here with relief of the tightness and discomfort to her back. Will begin antibiotic. She will keep f/u visit with Dr. Juanetta Gosling later today. MDM Reviewed: nursing note and vitals Reviewed previous: labs and ECG Interpretation: labs, ECG and x-ray         Nicoletta Dress. Colon Branch, MD 03/19/11 0600

## 2011-03-19 NOTE — Assessment & Plan Note (Signed)
I'm not sure of the etiology of this patient's pain. She has a normal EKG and she had negative enzymes in the hospital overnight. Her symptoms have completely resolved. The severity of her pain is concerning to me. I think with her history of PE, we should exclude the possibility with a CT angiogram of the chest. She will go to [facility] in the morning for this study. She was advised that if she has any recurrent chest pain she needs to activate EMS. However, she is she appears to be clinically stable at this point with no further symptoms. She had a normal nuclear stress scan about 11 months ago in without any exertional symptoms I don't know that we need to proceed with a repeat stress test. She will followup with Dr. Daleen Squibb in a few weeks.

## 2011-03-19 NOTE — Assessment & Plan Note (Signed)
Appears stable. EKG and enzymes were reviewed and these were normal. If recurrent chest pain she will need repeat stress testing.

## 2011-03-19 NOTE — ED Notes (Signed)
Patient woke up from sleep few minutes ago, mid chest pain, diaphoretic at first none now, denies SOB or N/V, left arm numbness at first but denies now, one SL NTG took PTA without relief

## 2011-03-20 ENCOUNTER — Ambulatory Visit (HOSPITAL_COMMUNITY)
Admission: RE | Admit: 2011-03-20 | Discharge: 2011-03-20 | Disposition: A | Discharge status: Home / Self Care | Payer: Medicare Other | Source: Ambulatory Visit | Attending: Cardiovascular Disease | Admitting: Cardiovascular Disease

## 2011-03-20 DIAGNOSIS — R0789 Other chest pain: Secondary | ICD-10-CM | POA: Insufficient documentation

## 2011-03-20 LAB — URINE CULTURE: Culture  Setup Time: 201208090547

## 2011-03-20 MED ORDER — IOHEXOL 350 MG/ML SOLN
100.0000 mL | Freq: Once | INTRAVENOUS | Status: AC | PRN
  Administered 2011-03-20: 100 mL via INTRAVENOUS

## 2011-03-24 ENCOUNTER — Encounter: Payer: Self-pay | Admitting: *Deleted

## 2011-03-26 ENCOUNTER — Encounter (INDEPENDENT_AMBULATORY_CARE_PROVIDER_SITE_OTHER): Payer: Self-pay

## 2011-03-26 ENCOUNTER — Encounter (INDEPENDENT_AMBULATORY_CARE_PROVIDER_SITE_OTHER): Payer: Self-pay | Admitting: *Deleted

## 2011-03-31 ENCOUNTER — Encounter: Payer: Self-pay | Admitting: Cardiology

## 2011-04-06 ENCOUNTER — Ambulatory Visit (INDEPENDENT_AMBULATORY_CARE_PROVIDER_SITE_OTHER): Payer: Medicare Other | Admitting: Internal Medicine

## 2011-04-15 ENCOUNTER — Telehealth: Payer: Self-pay | Admitting: Physician Assistant

## 2011-04-15 ENCOUNTER — Telehealth: Payer: Self-pay | Admitting: Cardiology

## 2011-04-15 NOTE — Telephone Encounter (Signed)
Pt calling wanting to talk to nurse to discuss "spells" pt has been having w/ heart. Pt c/o pain in chest and feeling like ton of breaks on pt and having difficulty breathing. Pt has real bad "spell" Saturday and has yet to recover. Pt went to ER in [place] before w/ same problem, pt said she was told she had a virus. Pt said she is sure she doesn't have a virus. Msg was transferred to triage.

## 2011-04-15 NOTE — Telephone Encounter (Signed)
NA --see phone note by Gene Serpe 04/15/11 12:05pm

## 2011-04-15 NOTE — Telephone Encounter (Signed)
Pt called Sat (9/1), c/o CP, SOB, diaph. Symptoms began approx. 7:30 AM. She took ii NTG tabs.   Pt advised to contact EMS, and proceed directly to [facility] ED.

## 2011-04-16 NOTE — Progress Notes (Signed)
error 

## 2011-04-16 NOTE — Telephone Encounter (Signed)
Will forward to Dr. Vern Claude nurse, Eunice Blase.

## 2011-04-17 NOTE — Telephone Encounter (Signed)
lmtcb/dfg

## 2011-04-21 ENCOUNTER — Encounter: Payer: Self-pay | Admitting: Cardiology

## 2011-04-21 ENCOUNTER — Ambulatory Visit (INDEPENDENT_AMBULATORY_CARE_PROVIDER_SITE_OTHER): Payer: Medicare Other | Admitting: Cardiology

## 2011-04-21 ENCOUNTER — Ambulatory Visit: Payer: Medicare Other | Admitting: Cardiology

## 2011-04-21 VITALS — BP 151/68 | HR 85 | Resp 14 | Ht 63.0 in | Wt 165.0 lb

## 2011-04-21 DIAGNOSIS — R079 Chest pain, unspecified: Secondary | ICD-10-CM

## 2011-04-21 DIAGNOSIS — I2699 Other pulmonary embolism without acute cor pulmonale: Secondary | ICD-10-CM

## 2011-04-21 DIAGNOSIS — R0602 Shortness of breath: Secondary | ICD-10-CM

## 2011-04-21 DIAGNOSIS — I2510 Atherosclerotic heart disease of native coronary artery without angina pectoris: Secondary | ICD-10-CM

## 2011-04-21 MED ORDER — NITROGLYCERIN 0.4 MG SL SUBL: 0.4000 mg | SUBLINGUAL_TABLET | SUBLINGUAL | Status: DC | PRN

## 2011-04-21 MED ORDER — METOPROLOL TARTRATE 50 MG PO TABS: 50.0000 mg | ORAL_TABLET | Freq: Two times a day (BID) | ORAL | Status: DC

## 2011-04-21 NOTE — Progress Notes (Signed)
HPI [name] [name] returns today for chest pain. She was evaluated by Dr. Excell Seltzer last month. She had negative stress test 11 months ago. Her symptoms are nonexertional. CT [place] negative for pulmonary embolus in August.  She continues to have chest discomfort which is hard to describe. She is a poor historian. She has not tried nitroglycerin because is 77 years old.  Her EKG today shows normal sinus rhythm with no ST segment changes. Her blood pressure and heart rate are elevated. He   Past Medical History  Diagnosis Date  . Acute MI   . Diabetes mellitus   . Thyroid disease   . Hypertension   . High cholesterol   . Chest pain   . CAD (coronary artery disease)   . Palpitations   . Hyperlipidemia   . PE (pulmonary embolism)     Past Surgical History  Procedure Date  . Coronary angioplasty with stent placement   . Abdominal hysterectomy   . Cholecystectomy   . Tonsillectomy   . Appendectomy   . Abdominal cavity operation     Family History  Problem Relation Age of Onset  . Coronary artery disease    . Hypertension      History   Social History  . Marital Status: Married    Spouse Name: N/A    Number of Children: N/A  . Years of Education: N/A   Occupational History  . Not on file.   Social History Main Topics  . Smoking status: Never Smoker   . Smokeless tobacco: Not on file  . Alcohol Use: No  . Drug Use: No  . Sexually Active:    Other Topics Concern  . Not on file   Social History Narrative  . No narrative on file    No Known Allergies  Current Outpatient Prescriptions  Medication Sig Dispense Refill  . aMILoride (MIDAMOR) 5 MG tablet Take 5 mg by mouth daily.        Marland Kitchen aspirin 81 MG tablet Take 81 mg by mouth daily.        . clopidogrel (PLAVIX) 75 MG tablet Take 75 mg by mouth daily.        . fenofibrate (TRICOR) 145 MG tablet Take 145 mg by mouth daily.        . furosemide (LASIX) 20 MG tablet Take 20 mg by mouth daily.        .  hydrochlorothiazide 50 MG tablet Take 50 mg by mouth daily.        . insulin aspart (NOVOLOG) 100 UNIT/ML injection Inject 5 Units into the skin 3 (three) times daily before meals.        . insulin glargine (LANTUS) 100 UNIT/ML injection Inject 50 Units into the skin at bedtime.        Marland Kitchen levothyroxine (SYNTHROID, LEVOTHROID) 175 MCG tablet Take 175 mcg by mouth daily.        . metoprolol tartrate (LOPRESSOR) 25 MG tablet TAKE ONE TABLET TWICE DAILY  60 tablet  12  . nitroGLYCERIN (NITROSTAT) 0.4 MG SL tablet Place 0.4 mg under the tongue every 5 (five) minutes as needed.        . potassium chloride SA (K-DUR,KLOR-CON) 20 MEQ tablet Take 20 mEq by mouth daily.        . rosuvastatin (CRESTOR) 40 MG tablet Take 40 mg by mouth daily.          ROS Negative other than HPI.   PE General Appearance: well developed, well nourished  in no acute distress HEENT: symmetrical face, PERRLA, good dentition  Neck: no JVD, thyromegaly, or adenopathy, trachea midline Chest: symmetric without deformity Cardiac: PMI non-displaced, RRR, normal S1, S2, no gallop or murmur Lung: clear to ausculation and percussion Vascular: all pulses full without bruits  Abdominal: nondistended, nontender, good bowel sounds, no HSM, no bruits Extremities: no cyanosis, clubbing or edema, no sign of DVT, no varicosities  Skin: normal color, no rashes Neuro: alert and oriented x 3, non-focal Pysch: normal affect Filed Vitals:   04/21/11 0910  BP: 151/68  Pulse: 85  Resp: 14  Height: 5\' 3"  (1.6 m)  Weight: 165 lb (74.844 kg)    EKG  Labs and Studies Reviewed.   Lab Results  Component Value Date   WBC 16.1* 03/19/2011   HGB 13.7 03/19/2011   HCT 41.2 03/19/2011   MCV 90.0 03/19/2011   PLT 235 03/19/2011      Chemistry      Component Value Date/Time   NA 138 03/19/2011 0257   K 3.0* 03/19/2011 0257   CL 98 03/19/2011 0257   CO2 19 03/19/2011 0257   BUN 20 03/19/2011 0257   CREATININE 0.79 03/19/2011 0257      Component Value  Date/Time   CALCIUM 9.5 03/19/2011 0257   AST 16 12/17/2010   ALT 16 12/17/2010       No results found for this basename: CHOL   No results found for this basename: HDL   No results found for this basename: LDLCALC   No results found for this basename: TRIG   No results found for this basename: CHOLHDL   No results found for this basename: HGBA1C   Lab Results  Component Value Date   ALT 16 12/17/2010   AST 16 12/17/2010   No results found for this basename: TSH

## 2011-04-21 NOTE — Assessment & Plan Note (Signed)
She is a difficult historian and not sure the etiology of her chest pain. Pulmonary embolus ruled out. Recent stress test was normal.  I have renewed her nitroglycerin which is 75 years old. I will also increase her metoprolol to 50 mg twice a day.  Reassurance given.

## 2011-04-21 NOTE — Patient Instructions (Signed)
Your physician has recommended you make the following change in your medication: increase metoprolol Take nitroglycerin as directed for chest pain (angina)  Please see handout given  Your physician recommends that you schedule a follow-up appointment in:  6 months with Dr. Daleen Squibb

## 2011-05-13 ENCOUNTER — Encounter: Payer: Self-pay | Admitting: Cardiovascular Disease

## 2011-08-07 ENCOUNTER — Emergency Department (HOSPITAL_COMMUNITY): Payer: Medicare Other

## 2011-08-07 ENCOUNTER — Other Ambulatory Visit: Payer: Self-pay

## 2011-08-07 ENCOUNTER — Encounter (HOSPITAL_COMMUNITY): Payer: Self-pay | Admitting: Emergency Medicine

## 2011-08-07 ENCOUNTER — Emergency Department (HOSPITAL_COMMUNITY)
Admission: EM | Admit: 2011-08-07 | Discharge: 2011-08-07 | Disposition: A | Discharge status: Home / Self Care | Payer: Medicare Other | Attending: Emergency Medicine | Admitting: Emergency Medicine

## 2011-08-07 DIAGNOSIS — R079 Chest pain, unspecified: Secondary | ICD-10-CM | POA: Insufficient documentation

## 2011-08-07 DIAGNOSIS — I2510 Atherosclerotic heart disease of native coronary artery without angina pectoris: Secondary | ICD-10-CM | POA: Insufficient documentation

## 2011-08-07 DIAGNOSIS — I10 Essential (primary) hypertension: Secondary | ICD-10-CM | POA: Insufficient documentation

## 2011-08-07 DIAGNOSIS — R739 Hyperglycemia, unspecified: Secondary | ICD-10-CM

## 2011-08-07 DIAGNOSIS — Z794 Long term (current) use of insulin: Secondary | ICD-10-CM | POA: Insufficient documentation

## 2011-08-07 DIAGNOSIS — Z86718 Personal history of other venous thrombosis and embolism: Secondary | ICD-10-CM | POA: Insufficient documentation

## 2011-08-07 DIAGNOSIS — Z9861 Coronary angioplasty status: Secondary | ICD-10-CM | POA: Insufficient documentation

## 2011-08-07 DIAGNOSIS — E119 Type 2 diabetes mellitus without complications: Secondary | ICD-10-CM | POA: Insufficient documentation

## 2011-08-07 DIAGNOSIS — E079 Disorder of thyroid, unspecified: Secondary | ICD-10-CM | POA: Insufficient documentation

## 2011-08-07 DIAGNOSIS — Z9079 Acquired absence of other genital organ(s): Secondary | ICD-10-CM | POA: Insufficient documentation

## 2011-08-07 DIAGNOSIS — E7800 Pure hypercholesterolemia, unspecified: Secondary | ICD-10-CM | POA: Insufficient documentation

## 2011-08-07 DIAGNOSIS — I252 Old myocardial infarction: Secondary | ICD-10-CM | POA: Insufficient documentation

## 2011-08-07 DIAGNOSIS — E785 Hyperlipidemia, unspecified: Secondary | ICD-10-CM | POA: Insufficient documentation

## 2011-08-07 LAB — CARDIAC PANEL(CRET KIN+CKTOT+MB+TROPI)
CK, MB: 3.3 ng/mL (ref 0.3–4.0)
Troponin I: <0.3 ng/mL (ref <0.30)

## 2011-08-07 LAB — BASIC METABOLIC PANEL
Calcium: 9.6 mg/dL (ref 8.4–10.5)
GFR calc Af Amer: 73 mL/min — ABNORMAL LOW (ref >90)
GFR calc non Af Amer: 63 mL/min — ABNORMAL LOW (ref >90)
Potassium: 3.8 mEq/L (ref 3.5–5.1)
Sodium: 136 mEq/L (ref 135–145)

## 2011-08-07 LAB — CBC
MCH: 30.3 pg (ref 26.0–34.0)
MCHC: 33.6 g/dL (ref 30.0–36.0)
Platelets: 201 10*3/uL (ref 150–400)
RDW: 13.5 % (ref 11.5–15.5)

## 2011-08-07 NOTE — ED Notes (Signed)
Pt c/o cp/pressure since 0500 this am. Pt states she took 2 sl nitro at 6am and cp went away but returned approximately 45 minutes ago.

## 2011-08-07 NOTE — ED Provider Notes (Signed)
History   This chart was scribed for [name] Hutching, MD by Clarita Crane. The patient was seen in room APA10/APA10 and the patient's care was started at 12:27AM.   CSN: [id number]  Arrival date & time 08/07/11  1145   First MD Initiated Contact with Patient 08/07/11 1206      Chief Complaint  Patient presents with  . Chest Pain    (Consider location/radiation/quality/duration/timing/severity/associated sxs/prior treatment) HPI [name] [name] is a 77 y.o. female who presents to the Emergency Department complaining of constant moderate to severe chest pain described as tightness onset this morning and persistent since with associated mild SOB and diaphoresis. Notes current symptoms are similar to those previously experienced with 2 previous MIs (August 2012 and 2010). Patient reports mild relief with use of 2 NTGs and an 81mg  aspirin prior to arrival.  Denies nausea, vomiting. Patient with h/o diabetes, hypertension, high cholesterol, CAD, palpitations and PE.   Cardiologist-Wall PCP- Juanetta Gosling  Past Medical History  Diagnosis Date  . Acute MI   . Diabetes mellitus   . Thyroid disease   . Hypertension   . High cholesterol   . Chest pain   . CAD (coronary artery disease)   . Palpitations   . Hyperlipidemia   . PE (pulmonary embolism)     Past Surgical History  Procedure Date  . Coronary angioplasty with stent placement   . Abdominal hysterectomy   . Cholecystectomy   . Tonsillectomy   . Appendectomy   . Abdominal cavity operation     Family History  Problem Relation Age of Onset  . Coronary artery disease    . Hypertension      History  Substance Use Topics  . Smoking status: Never Smoker   . Smokeless tobacco: Not on file  . Alcohol Use: No    OB History    Grav Para Term Preterm Abortions TAB SAB Ect Mult Living                  Review of Systems 10 Systems reviewed and are negative for acute change except as noted in the HPI.  Allergies  Review of  patient's allergies indicates no known allergies.  Home Medications   Current Outpatient Rx  Name Route Sig Dispense Refill  . AMILORIDE HCL 5 MG PO TABS Oral Take 5 mg by mouth daily.      . ASPIRIN 81 MG PO TABS Oral Take 81 mg by mouth daily.      Marland Kitchen CLOPIDOGREL BISULFATE 75 MG PO TABS Oral Take 75 mg by mouth daily.      . FENOFIBRATE 145 MG PO TABS Oral Take 145 mg by mouth daily.      . FUROSEMIDE 20 MG PO TABS Oral Take 20 mg by mouth daily.      Marland Kitchen HYDROCHLOROTHIAZIDE 50 MG PO TABS Oral Take 50 mg by mouth daily.      . INSULIN ASPART 100 UNIT/ML [place] SOLN Subcutaneous Inject 5 Units into the skin 3 (three) times daily before meals.      . INSULIN GLARGINE 100 UNIT/ML [place] SOLN Subcutaneous Inject 50 Units into the skin at bedtime.      Marland Kitchen LEVOTHYROXINE SODIUM 175 MCG PO TABS Oral Take 175 mcg by mouth daily.      Marland Kitchen METOPROLOL TARTRATE 50 MG PO TABS Oral Take 1 tablet (50 mg total) by mouth 2 (two) times daily. 60 tablet 12  . NITROGLYCERIN 0.4 MG SL SUBL Sublingual Place 1 tablet (  0.4 mg total) under the tongue every 5 (five) minutes as needed. 25 tablet 8  . POTASSIUM CHLORIDE CRYS CR 20 MEQ PO TBCR Oral Take 20 mEq by mouth daily.      Marland Kitchen ROSUVASTATIN CALCIUM 40 MG PO TABS Oral Take 40 mg by mouth daily.        BP 157/78  Pulse 89  Temp 97.6 F (36.4 C)  Resp 20  Ht 5' 3.5" (1.613 m)  Wt 160 lb (72.576 kg)  BMI 27.90 kg/m2  SpO2 100%  Physical Exam  Nursing note and vitals reviewed. Constitutional: She is oriented to person, place, and time. She appears well-developed and well-nourished. No distress.  HENT:  Head: Normocephalic and atraumatic.  Eyes: EOM are normal. Pupils are equal, round, and reactive to light.  Neck: Neck supple. No tracheal deviation present.  Cardiovascular: Normal rate and regular rhythm.   No murmur heard. Pulmonary/Chest: Effort normal. No respiratory distress. She has no wheezes.  Abdominal: Soft. She exhibits no distension.  Musculoskeletal:  Normal range of motion. She exhibits no edema and no tenderness.  Neurological: She is alert and oriented to person, place, and time. No sensory deficit.  Skin: Skin is warm and dry.  Psychiatric: She has a normal mood and affect. Her behavior is normal.    ED Course  Procedures (including critical care time)  DIAGNOSTIC STUDIES: Oxygen Saturation is 100% on room air, normal by my interpretation.    COORDINATION OF CARE: 12:27PM- Patient informed of current plan of treatment and discussed possible admission. Agrees with plan set forth at this time.  2:34PM- Patient informed of lab and imaging results. Patient expresses interest in being d/c home. Advised of reasons to return to ED. Patient agrees with plan set forth at this time.   Labs Reviewed  BASIC METABOLIC PANEL - Abnormal; Notable for the following:    Glucose, Bld 334 (*)    BUN 24 (*)    GFR calc non Af Amer 63 (*)    GFR calc Af Amer 73 (*)    All other components within normal limits  CBC  CARDIAC PANEL(CRET KIN+CKTOT+MB+TROPI)   Dg Chest Portable 1 View  08/07/2011  *RADIOLOGY REPORT*  Clinical Data: Chest pain  PORTABLE CHEST - 1 VIEW  Comparison: 03/19/2011  Findings: Mild cardiomegaly.  Clear lungs.  No pneumothorax. Dextroscoliosis of the mid thoracic spine is stable.  No definite effusion.  IMPRESSION: No active cardiopulmonary disease.  Original Report Authenticated By: Donavan Burnet, [name]     No diagnosis found.  Date: 08/07/2011  Rate: 86  Rhythm: normal sinus rhythm  QRS Axis: normal  Intervals: normal  ST/T Wave abnormalities: normal  Conduction Disutrbances:none  Narrative Interpretation:   Old EKG Reviewed: none available   ?    MDM  Patient feeling much better now. Good color. Normal cardiac enzyme and EKG. Symptoms started 0500.  Will expect troponin to be elevated by this time.  Patient wants to go home. She understands return for any worsening condition       I personally performed  the services described in this documentation, which was scribed in my presence. The recorded information has been reviewed and considered.    [name] Hutching, MD 08/08/11 1352

## 2011-08-07 NOTE — ED Notes (Signed)
Pt states that she woke up from her sleep this am with cp, pt states that the pain feels like a tightness, pt took two nitro at home with improvement in pain after each nitro but the pain returned, pt states that she had a MI in august of this past year, pt did states that she experienced diaphoresis with the cp this am

## 2011-10-14 ENCOUNTER — Emergency Department (HOSPITAL_COMMUNITY)
Admission: EM | Admit: 2011-10-14 | Discharge: 2011-10-14 | Disposition: A | Discharge status: Home / Self Care | Payer: Medicare Other | Attending: Emergency Medicine | Admitting: Emergency Medicine

## 2011-10-14 ENCOUNTER — Emergency Department (HOSPITAL_COMMUNITY): Payer: Medicare Other

## 2011-10-14 ENCOUNTER — Encounter (HOSPITAL_COMMUNITY): Payer: Self-pay

## 2011-10-14 ENCOUNTER — Other Ambulatory Visit: Payer: Self-pay

## 2011-10-14 DIAGNOSIS — R51 Headache: Secondary | ICD-10-CM | POA: Insufficient documentation

## 2011-10-14 DIAGNOSIS — I10 Essential (primary) hypertension: Secondary | ICD-10-CM | POA: Insufficient documentation

## 2011-10-14 DIAGNOSIS — X58XXXA Exposure to other specified factors, initial encounter: Secondary | ICD-10-CM | POA: Insufficient documentation

## 2011-10-14 DIAGNOSIS — M542 Cervicalgia: Secondary | ICD-10-CM | POA: Insufficient documentation

## 2011-10-14 DIAGNOSIS — E785 Hyperlipidemia, unspecified: Secondary | ICD-10-CM | POA: Insufficient documentation

## 2011-10-14 DIAGNOSIS — M25519 Pain in unspecified shoulder: Secondary | ICD-10-CM | POA: Insufficient documentation

## 2011-10-14 DIAGNOSIS — I252 Old myocardial infarction: Secondary | ICD-10-CM | POA: Insufficient documentation

## 2011-10-14 DIAGNOSIS — S139XXA Sprain of joints and ligaments of unspecified parts of neck, initial encounter: Secondary | ICD-10-CM | POA: Insufficient documentation

## 2011-10-14 DIAGNOSIS — Z86711 Personal history of pulmonary embolism: Secondary | ICD-10-CM | POA: Insufficient documentation

## 2011-10-14 DIAGNOSIS — S161XXA Strain of muscle, fascia and tendon at neck level, initial encounter: Secondary | ICD-10-CM

## 2011-10-14 DIAGNOSIS — E119 Type 2 diabetes mellitus without complications: Secondary | ICD-10-CM | POA: Insufficient documentation

## 2011-10-14 DIAGNOSIS — Z794 Long term (current) use of insulin: Secondary | ICD-10-CM | POA: Insufficient documentation

## 2011-10-14 DIAGNOSIS — I2510 Atherosclerotic heart disease of native coronary artery without angina pectoris: Secondary | ICD-10-CM | POA: Insufficient documentation

## 2011-10-14 DIAGNOSIS — E7800 Pure hypercholesterolemia, unspecified: Secondary | ICD-10-CM | POA: Insufficient documentation

## 2011-10-14 MED ORDER — HYDROCODONE-ACETAMINOPHEN 5-325 MG PO TABS: 1.0000 | ORAL_TABLET | Freq: Four times a day (QID) | ORAL | Status: AC | PRN

## 2011-10-14 MED ORDER — HYDROCODONE-ACETAMINOPHEN 5-325 MG PO TABS
1.0000 | ORAL_TABLET | Freq: Once | ORAL | Status: AC
  Administered 2011-10-14: 1 via ORAL
  Filled 2011-10-14: qty 1

## 2011-10-14 NOTE — ED Notes (Signed)
C/o neck pain with movement that radiates to both shoulders. Denies any injury. Pt states she is unable to move neck from side to side. Denies any dizziness or headache. A/o x3. resp even/nonlabored. nad noted.

## 2011-10-14 NOTE — Discharge Instructions (Signed)
Use heating pad as needed take pain medicine as needed. Rest. Followup with your doctor in the next 2 days if not improving. Return for new or worse symptoms.

## 2011-10-14 NOTE — ED Provider Notes (Signed)
History   This chart was scribed for [name] Jakes, MD by Clarita Crane. The patient was seen in room APA07/APA07. Patient's care was started at 0949.    CSN: [id number]  Arrival date & time 10/14/11  4098   First MD Initiated Contact with Patient 10/14/11 1001      Chief Complaint  Patient presents with  . Neck Pain  . Shoulder Pain    (Consider location/radiation/quality/duration/timing/severity/associated sxs/prior treatment) HPI [name] [name] is a 78 y.o. female who presents to the Emergency Department complaining of constant moderate to severe neck pain radiating to bilateral shoulders with gradual onset 1 week ago and worsening since with onset of a HA this morning. Reports pain is aggravated with movement of neck and is not relieved with use of Tylenol. Denies tingling, numbness, nausea, vomiting, diarrhea, fever, chest pain, abdominal pain, rash, congestion, cough, blurred vision. Denies history of similar symptoms, recent injury. Patient with h/o MI, diabetes, thyroid disease, HTN, high cholesterol, CAD, HLD, PE.  PCP- Juanetta Gosling  Past Medical History  Diagnosis Date  . Acute MI   . Diabetes mellitus   . Thyroid disease   . Hypertension   . High cholesterol   . Chest pain   . CAD (coronary artery disease)   . Palpitations   . Hyperlipidemia   . PE (pulmonary embolism)     Past Surgical History  Procedure Date  . Coronary angioplasty with stent placement   . Abdominal hysterectomy   . Cholecystectomy   . Tonsillectomy   . Appendectomy   . Abdominal cavity operation     Family History  Problem Relation Age of Onset  . Coronary artery disease    . Hypertension      History  Substance Use Topics  . Smoking status: Never Smoker   . Smokeless tobacco: Not on file  . Alcohol Use: No    OB History    Grav Para Term Preterm Abortions TAB SAB Ect Mult Living                  Review of Systems  Constitutional: Negative for fever.  HENT: Positive for  neck pain. Negative for rhinorrhea and neck stiffness.   Eyes: Negative for pain.  Respiratory: Negative for cough and shortness of breath.   Cardiovascular: Negative for chest pain.  Gastrointestinal: Negative for nausea, vomiting, abdominal pain and diarrhea.  Genitourinary: Negative for dysuria.  Musculoskeletal: Negative for back pain.  Skin: Negative for rash.  Neurological: Negative for weakness, numbness and headaches.    Allergies  Review of patient's allergies indicates no known allergies.  Home Medications   Current Outpatient Rx  Name Route Sig Dispense Refill  . AMILORIDE-HYDROCHLOROTHIAZIDE 5-50 MG PO TABS Oral Take 1 tablet by mouth daily.      . ASPIRIN EC 81 MG PO TBEC Oral Take 81 mg by mouth daily.    . FENOFIBRATE 145 MG PO TABS Oral Take 145 mg by mouth daily.     . INSULIN ASPART 100 UNIT/ML [place] SOLN Subcutaneous Inject 15 Units into the skin 3 (three) times daily after meals.     . INSULIN GLARGINE 100 UNIT/ML [place] SOLN Subcutaneous Inject 80 Units into the skin at bedtime.     Marland Kitchen LEVOTHYROXINE SODIUM 175 MCG PO TABS Oral Take 175 mcg by mouth daily.     Marland Kitchen METOPROLOL TARTRATE 50 MG PO TABS Oral Take 1 tablet (50 mg total) by mouth 2 (two) times daily. 60 tablet 12  .  ROSUVASTATIN CALCIUM 40 MG PO TABS Oral Take 40 mg by mouth at bedtime.     Marland Kitchen HYDROCODONE-ACETAMINOPHEN 5-325 MG PO TABS Oral Take 1-2 tablets by mouth every 6 (six) hours as needed for pain. 14 tablet 0  . NITROGLYCERIN 0.4 MG SL SUBL Sublingual Place 0.4 mg under the tongue every 5 (five) minutes x 3 doses as needed.        BP 186/81  Pulse 110  Temp(Src) 98.4 F (36.9 C) (Oral)  Resp 16  Ht 5' 3.5" (1.613 m)  Wt 160 lb (72.576 kg)  BMI 27.90 kg/m2  SpO2 98%  Physical Exam  Nursing note and vitals reviewed. Constitutional: She is oriented to person, place, and time. She appears well-developed and well-nourished. No distress.  HENT:  Head: Normocephalic and atraumatic.  Eyes: EOM are  normal. Pupils are equal, round, and reactive to light.  Neck: Neck supple. No tracheal deviation present.  Cardiovascular: Normal rate.   Pulmonary/Chest: Effort normal. No respiratory distress.  Abdominal: Soft. She exhibits no distension.  Musculoskeletal: Normal range of motion. She exhibits no edema.       Trapezius muscle with increased tension/spasm. No cervical or paracervical tenderness.   Neurological: She is alert and oriented to person, place, and time. No cranial nerve deficit or sensory deficit.  Skin: Skin is warm and dry.  Psychiatric: She has a normal mood and affect. Her behavior is normal.    ED Course  Procedures (including critical care time)  DIAGNOSTIC STUDIES: Oxygen Saturation is 98% on room air, normal by my interpretation.    COORDINATION OF CARE: 10:24AM- Patient informed of current plan for treatment and evaluation and agrees with plan at this time.     Date: 10/14/2011  Rate: 96  Rhythm: normal sinus rhythm  QRS Axis: normal  Intervals: normal  ST/T Wave abnormalities: nonspecific ST changes  Conduction Disutrbances:none  Narrative Interpretation:   Old EKG Reviewed: unchanged No significant change in EKG from 08/07/2011  Labs Reviewed - No data to display Dg Cervical Spine Complete  10/14/2011  *RADIOLOGY REPORT*  Clinical Data: Neck pain, right shoulder pain.  CERVICAL SPINE - COMPLETE 4+ VIEW  Comparison: None.  Findings: Degenerative disc disease changes at C4-5 and C5-6. Degenerative facet disease also noted causing mild bilateral neural foraminal narrowing at these levels.  This is slightly more pronounced on the right.  Normal alignment.  Prevertebral soft tissues are normal.  No fracture.  IMPRESSION: Spondylosis as above.  No acute findings.  Original Report Authenticated By: Cyndie Chime, [name]     1. Cervical strain       MDM  Suspect cervical strain muscle spasm into the trapezius area x-rays without any significant degenerative  changes. Patient without any upper extremity or lower extremity neuro deficits. Will treat with rest pain medicine and heat pad followup with her primary care Dr.      I personally performed the services described in this documentation, which was scribed in my presence. The recorded information has been reviewed and considered.     [name] Jakes, MD 10/14/11 [phone]

## 2011-10-14 NOTE — ED Notes (Signed)
Family at bedside. Patient states she is having real bad pain in her shoulders and neck. Advised patient nothing could be given till the Doctor has seen her.

## 2011-11-03 ENCOUNTER — Ambulatory Visit (INDEPENDENT_AMBULATORY_CARE_PROVIDER_SITE_OTHER): Payer: Medicare Other | Admitting: Cardiology

## 2011-11-03 ENCOUNTER — Encounter: Payer: Self-pay | Admitting: Cardiology

## 2011-11-03 VITALS — BP 148/85 | HR 77 | Resp 18 | Ht 63.0 in | Wt 172.0 lb

## 2011-11-03 DIAGNOSIS — E785 Hyperlipidemia, unspecified: Secondary | ICD-10-CM

## 2011-11-03 DIAGNOSIS — I10 Essential (primary) hypertension: Secondary | ICD-10-CM

## 2011-11-03 DIAGNOSIS — I2699 Other pulmonary embolism without acute cor pulmonale: Secondary | ICD-10-CM

## 2011-11-03 DIAGNOSIS — I2510 Atherosclerotic heart disease of native coronary artery without angina pectoris: Secondary | ICD-10-CM

## 2011-11-03 DIAGNOSIS — I252 Old myocardial infarction: Secondary | ICD-10-CM | POA: Insufficient documentation

## 2011-11-03 NOTE — Assessment & Plan Note (Signed)
Mildly elevated today. Encouraged her to keep that less than 140/90

## 2011-11-03 NOTE — Progress Notes (Signed)
HPI [name] [name] comes in today for evaluation and management of her coronary artery disease, history mixed hyperlipidemia, old myocardial infarction, hypertension, and history of pulmonary embolus.  His recent emergency room for right shoulder and neck pain. He was diagnosed with musculoskeletal strain and cervical strain. The pain is gone away now. She denies any angina or ischemic symptoms.  She is compliant with medications.  Past Medical History  Diagnosis Date  . Acute MI   . Diabetes mellitus   . Thyroid disease   . Hypertension   . High cholesterol   . Chest pain   . CAD (coronary artery disease)   . Palpitations   . Hyperlipidemia   . PE (pulmonary embolism)     Current Outpatient Prescriptions  Medication Sig Dispense Refill  . amiloride-hydrochlorothiazide (MODURETIC) 5-50 MG tablet Take 1 tablet by mouth daily.        Marland Kitchen aspirin EC 81 MG tablet Take 81 mg by mouth daily.      . fenofibrate (TRICOR) 145 MG tablet Take 145 mg by mouth daily.       . insulin aspart (NOVOLOG) 100 UNIT/ML injection Inject 15 Units into the skin 3 (three) times daily after meals.       . insulin glargine (LANTUS) 100 UNIT/ML injection Inject 80 Units into the skin at bedtime.       Marland Kitchen levothyroxine (SYNTHROID, LEVOTHROID) 175 MCG tablet Take 175 mcg by mouth daily.       . metoprolol tartrate (LOPRESSOR) 50 MG tablet Take 1 tablet (50 mg total) by mouth 2 (two) times daily.  60 tablet  12  . nitroGLYCERIN (NITROSTAT) 0.4 MG SL tablet Place 0.4 mg under the tongue every 5 (five) minutes x 3 doses as needed.        . rosuvastatin (CRESTOR) 40 MG tablet Take 40 mg by mouth at bedtime.         No Known Allergies  Family History  Problem Relation Age of Onset  . Coronary artery disease    . Hypertension      History   Social History  . Marital Status: Married    Spouse Name: N/A    Number of Children: N/A  . Years of Education: N/A   Occupational History  . Not on file.   Social  History Main Topics  . Smoking status: Never Smoker   . Smokeless tobacco: Not on file  . Alcohol Use: No  . Drug Use: No  . Sexually Active:    Other Topics Concern  . Not on file   Social History Narrative  . No narrative on file    ROS ALL NEGATIVE EXCEPT THOSE NOTED IN HPI  PE  General Appearance: well developed, well nourished in no acute distress HEENT: symmetrical face, PERRLA, good dentition  Neck: no JVD, thyromegaly, or adenopathy, trachea midline Chest: symmetric without deformity Cardiac: PMI non-displaced, RRR, normal S1, S2, no gallop or murmur Lung: clear to ausculation and percussion Vascular: all pulses full without bruits  Abdominal: nondistended, nontender, good bowel sounds, no HSM, no bruits Extremities: no cyanosis, clubbing or edema, no sign of DVT, no varicosities  Skin: normal color, no rashes Neuro: alert and oriented x 3, non-focal Pysch: normal affect  EKG  BMET    Component Value Date/Time   NA 136 08/07/2011 1202   K 3.8 08/07/2011 1202   CL 96 08/07/2011 1202   CO2 27 08/07/2011 1202   GLUCOSE 334* 08/07/2011 1202   BUN 24*  08/07/2011 1202   CREATININE 0.87 08/07/2011 1202   CALCIUM 9.6 08/07/2011 1202   GFRNONAA 63* 08/07/2011 1202   GFRAA 73* 08/07/2011 1202    Lipid Panel  No results found for this basename: chol, trig, hdl, cholhdl, vldl, ldlcalc    CBC    Component Value Date/Time   WBC 9.2 08/07/2011 1202   RBC 4.68 08/07/2011 1202   HGB 14.2 08/07/2011 1202   HCT 42.3 08/07/2011 1202   PLT 201 08/07/2011 1202   MCV 90.4 08/07/2011 1202   MCH 30.3 08/07/2011 1202   MCHC 33.6 08/07/2011 1202   RDW 13.5 08/07/2011 1202   LYMPHSABS 1.9 03/19/2011 0257   MONOABS 1.1* 03/19/2011 0257   EOSABS 0.1 03/19/2011 0257   BASOSABS 0.1 03/19/2011 0257

## 2011-11-03 NOTE — Assessment & Plan Note (Signed)
Stable.  Continue current medications.

## 2011-11-03 NOTE — Assessment & Plan Note (Signed)
I'll have the research nurses talked to her about the [employer]ELER[employer] Trial.

## 2011-11-03 NOTE — Patient Instructions (Signed)
Your physician wants you to follow-up in:  6 months. You will receive a reminder letter in the mail two months in advance. If you don't receive a letter, please call our office to schedule the follow-up appointment.   

## 2012-04-14 ENCOUNTER — Telehealth: Payer: Self-pay | Admitting: *Deleted

## 2012-04-14 ENCOUNTER — Encounter (HOSPITAL_COMMUNITY): Payer: Self-pay | Admitting: *Deleted

## 2012-04-14 ENCOUNTER — Emergency Department (HOSPITAL_COMMUNITY): Payer: Medicare Other

## 2012-04-14 ENCOUNTER — Emergency Department (HOSPITAL_COMMUNITY)
Admission: EM | Admit: 2012-04-14 | Discharge: 2012-04-14 | Disposition: A | Discharge status: Home / Self Care | Payer: Medicare Other | Attending: Emergency Medicine | Admitting: Emergency Medicine

## 2012-04-14 DIAGNOSIS — Z794 Long term (current) use of insulin: Secondary | ICD-10-CM | POA: Insufficient documentation

## 2012-04-14 DIAGNOSIS — R42 Dizziness and giddiness: Secondary | ICD-10-CM | POA: Insufficient documentation

## 2012-04-14 DIAGNOSIS — E7800 Pure hypercholesterolemia, unspecified: Secondary | ICD-10-CM | POA: Insufficient documentation

## 2012-04-14 DIAGNOSIS — Z86711 Personal history of pulmonary embolism: Secondary | ICD-10-CM | POA: Insufficient documentation

## 2012-04-14 DIAGNOSIS — Z79899 Other long term (current) drug therapy: Secondary | ICD-10-CM | POA: Insufficient documentation

## 2012-04-14 DIAGNOSIS — E119 Type 2 diabetes mellitus without complications: Secondary | ICD-10-CM | POA: Insufficient documentation

## 2012-04-14 DIAGNOSIS — R209 Unspecified disturbances of skin sensation: Secondary | ICD-10-CM | POA: Insufficient documentation

## 2012-04-14 DIAGNOSIS — R002 Palpitations: Secondary | ICD-10-CM | POA: Insufficient documentation

## 2012-04-14 DIAGNOSIS — I10 Essential (primary) hypertension: Secondary | ICD-10-CM | POA: Insufficient documentation

## 2012-04-14 DIAGNOSIS — I252 Old myocardial infarction: Secondary | ICD-10-CM | POA: Insufficient documentation

## 2012-04-14 DIAGNOSIS — E785 Hyperlipidemia, unspecified: Secondary | ICD-10-CM | POA: Insufficient documentation

## 2012-04-14 DIAGNOSIS — R200 Anesthesia of skin: Secondary | ICD-10-CM

## 2012-04-14 DIAGNOSIS — I2510 Atherosclerotic heart disease of native coronary artery without angina pectoris: Secondary | ICD-10-CM | POA: Insufficient documentation

## 2012-04-14 DIAGNOSIS — R079 Chest pain, unspecified: Secondary | ICD-10-CM | POA: Insufficient documentation

## 2012-04-14 LAB — BASIC METABOLIC PANEL
BUN: 18 mg/dL (ref 6–23)
Calcium: 9.5 mg/dL (ref 8.4–10.5)
GFR calc Af Amer: 66 mL/min — ABNORMAL LOW (ref >90)
GFR calc non Af Amer: 57 mL/min — ABNORMAL LOW (ref >90)
Glucose, Bld: 277 mg/dL — ABNORMAL HIGH (ref 70–99)
Potassium: 3.4 mEq/L — ABNORMAL LOW (ref 3.5–5.1)

## 2012-04-14 LAB — CBC WITH DIFFERENTIAL/PLATELET
Basophils Relative: 1 % (ref 0–1)
Eosinophils Absolute: 0.1 10*3/uL (ref 0.0–0.7)
Eosinophils Relative: 1 % (ref 0–5)
Lymphs Abs: 1.6 10*3/uL (ref 0.7–4.0)
MCH: 30.1 pg (ref 26.0–34.0)
MCHC: 34.4 g/dL (ref 30.0–36.0)
MCV: 87.5 fL (ref 78.0–100.0)
Monocytes Relative: 9 % (ref 3–12)
Neutrophils Relative %: 74 % (ref 43–77)
Platelets: 195 10*3/uL (ref 150–400)

## 2012-04-14 LAB — TSH: TSH: 0.025 u[IU]/mL — ABNORMAL LOW (ref 0.350–4.500)

## 2012-04-14 MED ORDER — IOHEXOL 350 MG/ML SOLN
100.0000 mL | Freq: Once | INTRAVENOUS | Status: AC | PRN
  Administered 2012-04-14: 100 mL via INTRAVENOUS

## 2012-04-14 MED ORDER — ASPIRIN 81 MG PO CHEW
324.0000 mg | CHEWABLE_TABLET | Freq: Once | ORAL | Status: AC
  Administered 2012-04-14: 324 mg via ORAL
  Filled 2012-04-14: qty 4

## 2012-04-14 NOTE — ED Notes (Signed)
Pt reports taking Nitro SL x 2 approx 1 hour PTA with some relief.

## 2012-04-14 NOTE — ED Notes (Signed)
Pt denies any SOB.  

## 2012-04-14 NOTE — ED Notes (Signed)
Pt c/o mid chest heaviness since yesterday afternoon while on riding lawn mower, stopped mowing and pt states that CP had eased some but woke up with CP this morning, had breakfast and then took 2 SL NTG without relief per pt, denies taking any ASA today

## 2012-04-14 NOTE — Telephone Encounter (Signed)
Pt called c/o "pain between my boobs" since yesterday with right arm heaviness and tingling.  Pain is slightly relived by ntg.  Pt was very anxious sounding on the phone.  Advised pt to go to the ED.  Pt agreed.

## 2012-04-14 NOTE — ED Notes (Signed)
Patient given Diet Coke and graham crackers.

## 2012-04-14 NOTE — ED Provider Notes (Signed)
History  This chart was scribed for Hurman Horn, MD by Bennett Scrape. This patient was seen in room APA09/APA09 and the patient's care was started at 2:04PM.  CSN: [id number]  Arrival date & time 04/14/12  1147   First MD Initiated Contact with Patient 04/14/12 1404      Chief Complaint  Patient presents with  . Chest Pain     The history is provided by the patient. No language interpreter was used.     [name] [name] is a 78 y.o. female who presents to the Emergency Department complaining of approximately 24 hours of gradual onset moderate to severe CP described as pressure with associated right arm weakness and numbness and palpitations started while she was riding a lawnmower on a level yard. She states that at first the CP became gradually worse over the first 4 to 5 hours but then began to gradually improved overnight; however, had not resolved by this morning. The pain has been worse with activity throughout the day and did not improve with two nitroglycerine pills. She denies leg weakness, facial droop, HA, confusion, changes in vision or speech, trouble swallowing , neck pain, SOB, abdominal pain, nausea, emesis, diarrhea, back pain and HA as associated symptoms. She has a h/o prior MI, DM and HTN. She denies smoking and alcohol use.  PCP is Dr. Juanetta Gosling. Dr. Juanito Doom is Cardiologist, no SOB or CP in March upon last visit. Next appointment is 04/29/12 (in 15 days)  Past Medical History  Diagnosis Date  . Acute MI   . Diabetes mellitus   . Thyroid disease   . Hypertension   . High cholesterol   . Chest pain   . CAD (coronary artery disease)   . Palpitations   . Hyperlipidemia   . PE (pulmonary embolism)     Past Surgical History  Procedure Date  . Coronary angioplasty with stent placement   . Abdominal hysterectomy   . Cholecystectomy   . Tonsillectomy   . Appendectomy   . Abdominal cavity operation     Family History  Problem Relation Age of Onset  .  Coronary artery disease    . Hypertension      History  Substance Use Topics  . Smoking status: Never Smoker   . Smokeless tobacco: Not on file  . Alcohol Use: No    No OB history provided.  Review of Systems  A complete 10 system review of systems was obtained and all systems are negative except as noted in the HPI and PMH.   Allergies  Review of patient's allergies indicates no known allergies.  Home Medications   Current Outpatient Rx  Name Route Sig Dispense Refill  . AMILORIDE-HYDROCHLOROTHIAZIDE 5-50 MG PO TABS Oral Take 1 tablet by mouth daily.      . ASPIRIN EC 81 MG PO TBEC Oral Take 81 mg by mouth daily.    . FENOFIBRATE 145 MG PO TABS Oral Take 145 mg by mouth daily.     . INSULIN ASPART 100 UNIT/ML [place] SOLN Subcutaneous Inject 15 Units into the skin 3 (three) times daily after meals.     . INSULIN GLARGINE 100 UNIT/ML  SOLN Subcutaneous Inject 80 Units into the skin at bedtime.     Marland Kitchen LEVOTHYROXINE SODIUM 175 MCG PO TABS Oral Take 175 mcg by mouth daily.     Marland Kitchen METOPROLOL TARTRATE 50 MG PO TABS Oral Take 1 tablet (50 mg total) by mouth 2 (two) times daily. 60 tablet  12  . NITROGLYCERIN 0.4 MG SL SUBL Sublingual Place 0.4 mg under the tongue every 5 (five) minutes x 3 doses as needed.      Marland Kitchen ROSUVASTATIN CALCIUM 40 MG PO TABS Oral Take 80 mg by mouth at bedtime.       Triage Vitals: BP 171/63  Temp 98.2 F (36.8 C) (Oral)  Resp 20  Ht 5\' 3"  (1.6 m)  Wt 160 lb (72.576 kg)  BMI 28.34 kg/m2  SpO2 99%  Physical Exam  Nursing note and vitals reviewed. Constitutional:       Awake, alert, nontoxic appearance with baseline speech for patient.  HENT:  Head: Atraumatic.  Mouth/Throat: No oropharyngeal exudate.  Eyes: EOM are normal. Pupils are equal, round, and reactive to light. Right eye exhibits no discharge. Left eye exhibits no discharge.  Neck: Neck supple.       Non-tender  Cardiovascular: Regular rhythm and normal heart sounds.  Tachycardia present.   No  murmur heard. Pulmonary/Chest: Effort normal and breath sounds normal. No stridor. No respiratory distress. She has no wheezes. She has no rales. She exhibits no tenderness.  Abdominal: Soft. Bowel sounds are normal. She exhibits no mass. There is no tenderness. There is no rebound.  Musculoskeletal: She exhibits no edema and no tenderness.       Baseline ROM, moves extremities with no obvious new focal weakness.  Lymphadenopathy:    She has no cervical adenopathy.  Neurological:       Awake, alert, cooperative and aware of situation; subjective weakness in the right arm compared to the left; slight decreased light touch to right arm compared to left ; peripheral visual fields full to confrontation; no facial asymmetry; tongue midline; major cranial nerves appear intact; no pronator drift, normal finger to nose bilaterally, baseline gait without new ataxia.   Skin: No rash noted.  Psychiatric: She has a normal mood and affect.    ED Course  Procedures (including critical care time) ECG: sinus tachycardia, ventricular rate 113, normal axis, normal intervals, nonspecific ST abnormality, compared with March 2013 rate is now faster DIAGNOSTIC STUDIES: Oxygen Saturation is 99% on room air, normal by my interpretation.    COORDINATION OF CARE: 2:15PM-Patient and Family understand and agree with initial ED impression and plan with expectations set for ED visit.  Pt stable in ED with no significant deterioration in condition.Patient / Family / Caregiver informed of clinical course, understand medical decision-making process, and agree with plan.   Labs Reviewed  BASIC METABOLIC PANEL - Abnormal; Notable for the following:    Potassium 3.4 (*)     Glucose, Bld 277 (*)     GFR calc non Af Amer 57 (*)     GFR calc Af Amer 66 (*)     All other components within normal limits  TSH - Abnormal; Notable for the following:    TSH 0.025 (*)     All other components within normal limits  CBC WITH  DIFFERENTIAL  TROPONIN I  PROTIME-INR  LAB REPORT - SCANNED   Ct Head Wo Contrast  04/14/2012  *RADIOLOGY REPORT*  Clinical Data: Chest pain.  Right arm and hand numbness. Lightheadedness.  Dizziness.  History of diabetes, CAD.  History of pulmonary embolus.  CT HEAD WITHOUT CONTRAST  Technique:  Contiguous axial images were obtained from the base of the skull through the vertex without contrast.  Comparison: CT head 01/27/2002  Findings: Again noted is low attenuation in the region of the left external capsule.  Findings are consistent with old lacunar infarction.  There is no intra or extra-axial fluid collection or mass.  There is no evidence for hemorrhage, mass lesion, or acute infarction. The basilar cisterns and ventricles have a appearance. Bone windows are unremarkable in appearance.  IMPRESSION:  1.  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 2. No evidence for acute intracranial abnormality.   Original Report Authenticated By: Patterson Hammersmith, [name]    Ct Angio Chest Pe W/cm &/or Wo Cm  04/14/2012  *RADIOLOGY REPORT*  Clinical Data: Chest pain.  Dizziness.  Previous history pulmonary embolism  CT ANGIOGRAPHY CHEST  Technique:  Multidetector CT imaging of the chest using the standard protocol during bolus administration of intravenous contrast. Multiplanar reconstructed images including MIPs were obtained and reviewed to evaluate the vascular anatomy.  Contrast: OMNIPAQUE IOHEXOL 350 MG/ML SOLN  Comparison: 03/20/2011  Findings: Satisfactory opacification of the pulmonary arteries noted, and there is no evidence of pulmonary emboli.  No evidence of thoracic aortic aneurysm or dissection.  No evidence of mediastinal hematoma or mass.  No lymphadenopathy identified within the thorax.  No evidence of pleural or pericardial effusion.  Both lungs are clear.  No evidence of central endobronchial lesion.  Hepatic steatosis incidentally noted.  IMPRESSION:  1.  No evidence of pulmonary embolism or other  active disease within the thorax. 2.  Hepatic steatosis incidentally noted.   Original Report Authenticated By: Danae Orleans, [name]    Mr Brain Wo Contrast  04/14/2012  *RADIOLOGY REPORT*  Clinical Data: Right arm weakness and numbness.  MRI HEAD WITHOUT CONTRAST  Technique:  Multiplanar, multiecho pulse sequences of the brain and surrounding structures were obtained according to standard protocol without intravenous contrast.  Comparison: Head CT same day.  Findings: Diffusion imaging does not show any acute or subacute infarction.  The brainstem is unremarkable.  There are a few old small vessel insults within the cerebellum.  The cerebral hemispheres show mild chronic small vessel change in the hemispheric deep white matter.  No cortical or large vessel territory infarction.  No mass lesion, hemorrhage, hydrocephalus or extra-axial collection.  No pituitary mass.  No inflammatory sinus disease.  No skull or skull base lesion.  IMPRESSION: No acute finding.  Mild chronic small vessel disease of the hemispheric deep white matter, less than often seen in healthy individuals of this age.   Original Report Authenticated By: Thomasenia Sales, [name]    Dg Chest Portable 1 View  04/14/2012  *RADIOLOGY REPORT*  Clinical Data: Chest pain  PORTABLE CHEST - 1 VIEW  Comparison: 08/07/2011  Findings: Lungs are essentially clear. No pleural effusion or pneumothorax.  Cardiomediastinal silhouette is within normal limits.  Mild upper thoracic dextroscoliosis.  IMPRESSION: No evidence of acute cardiopulmonary disease.   Original Report Authenticated By: Charline Bills, [name]      1. Chest pain   2. Right arm numbness       MDM  Doubt ACS/CVA.  I personally performed the services described in this documentation, which was scribed in my presence. The recorded information has been reviewed and considered.   Hurman Horn, MD 04/15/12 [phone]

## 2012-04-14 NOTE — ED Notes (Signed)
Discharge instructions reviewed with pt, questions answered. Pt verbalized understanding.  

## 2012-04-14 NOTE — ED Notes (Signed)
Central cp that started last night with dizziness, lightheadedness and right arm/hand numbness and weakness.

## 2012-04-14 NOTE — ED Notes (Signed)
Patient transported to CT 

## 2012-04-15 ENCOUNTER — Encounter: Payer: Medicare Other | Admitting: Adult Health

## 2012-04-15 ENCOUNTER — Telehealth: Payer: Self-pay | Admitting: Cardiology

## 2012-04-15 NOTE — Telephone Encounter (Signed)
I spoke with pt and she stated she was in the ED at [facility] yesterday with chest pain unrelieved by 2 sl NTG. She also experienced right arm pain. "They couldn't find anything except my heart was fast. Told me to call Dr. Daleen Squibb today"  Pt c/o of elevated heart rate at this time but denies chest pain or shortness of breath. She has not taken any NTG today.  I have called the [place] office for appt today with Lorin Picket and EKG. Pt agrees to this but at this time her neighbor has borrowed her car.   Her appointment is at 2pm  Mylo Red RN

## 2012-04-15 NOTE — Telephone Encounter (Signed)
New Problem:    Patient called in because she was in the ER in [place] all day yesterday and was instructed to call in today to speak with you.  Please call back.

## 2012-04-19 ENCOUNTER — Encounter: Payer: Medicare Other | Admitting: Adult Health

## 2012-05-02 ENCOUNTER — Ambulatory Visit: Payer: Medicare Other | Admitting: Cardiology

## 2012-05-05 ENCOUNTER — Encounter: Payer: Self-pay | Admitting: Cardiology

## 2012-05-05 ENCOUNTER — Ambulatory Visit (INDEPENDENT_AMBULATORY_CARE_PROVIDER_SITE_OTHER): Payer: Medicare Other | Admitting: Cardiology

## 2012-05-05 VITALS — BP 151/80 | HR 73 | Ht 63.0 in | Wt 164.0 lb

## 2012-05-05 DIAGNOSIS — I252 Old myocardial infarction: Secondary | ICD-10-CM

## 2012-05-05 DIAGNOSIS — E785 Hyperlipidemia, unspecified: Secondary | ICD-10-CM

## 2012-05-05 DIAGNOSIS — I2510 Atherosclerotic heart disease of native coronary artery without angina pectoris: Secondary | ICD-10-CM

## 2012-05-05 DIAGNOSIS — R079 Chest pain, unspecified: Secondary | ICD-10-CM

## 2012-05-05 DIAGNOSIS — I10 Essential (primary) hypertension: Secondary | ICD-10-CM

## 2012-05-05 DIAGNOSIS — R609 Edema, unspecified: Secondary | ICD-10-CM

## 2012-05-05 NOTE — Assessment & Plan Note (Signed)
See above note under atypical chest pain.

## 2012-05-05 NOTE — Assessment & Plan Note (Signed)
Her recent episode of severe chest discomfort unrelieved by nitroglycerin was most likely noncardiac and certainly not another pulmonary embolus. Reassurance given. I've advised her again that if this occurs to go to nitroglycerin protocol and then go into the emergency room by 911. I will see her back in the office in a year.

## 2012-05-05 NOTE — Patient Instructions (Addendum)
Your physician recommends that you continue on your current medications as directed. Please refer to the Current Medication list given to you today.   Your physician wants you to follow-up in: 1 year with Dr. Wall. You will receive a reminder letter in the mail two months in advance. If you don't receive a letter, please call our office to schedule the follow-up appointment.  

## 2012-05-05 NOTE — Progress Notes (Signed)
HPI [name] comes in today for followup after being emergency room with severe chest discomfort unresponsive to nitroglycerin.  I reviewed those records. She ruled out for myocardial infarction. Because of her history of pulmonary embolus, she had a repeat CT scan of the chest. This was negative.  Since that evaluation, she denies any angina.  Past Medical History  Diagnosis Date  . Acute MI   . Diabetes mellitus   . Thyroid disease   . Hypertension   . High cholesterol   . Chest pain   . CAD (coronary artery disease)   . Palpitations   . Hyperlipidemia   . PE (pulmonary embolism)     Current Outpatient Prescriptions  Medication Sig Dispense Refill  . amiloride-hydrochlorothiazide (MODURETIC) 5-50 MG tablet Take 1 tablet by mouth daily.        Marland Kitchen aspirin EC 81 MG tablet Take 81 mg by mouth daily.      . fenofibrate (TRICOR) 145 MG tablet Take 145 mg by mouth daily.       . insulin aspart (NOVOLOG) 100 UNIT/ML injection Inject 15 Units into the skin 3 (three) times daily after meals.       . insulin glargine (LANTUS) 100 UNIT/ML injection Inject 80 Units into the skin at bedtime.       Marland Kitchen levothyroxine (SYNTHROID, LEVOTHROID) 175 MCG tablet Take 175 mcg by mouth daily.       . metoprolol tartrate (LOPRESSOR) 50 MG tablet Take 1 tablet (50 mg total) by mouth 2 (two) times daily.  60 tablet  12  . nitroGLYCERIN (NITROSTAT) 0.4 MG SL tablet Place 0.4 mg under the tongue every 5 (five) minutes x 3 doses as needed.        . rosuvastatin (CRESTOR) 40 MG tablet Take 80 mg by mouth at bedtime.         No Known Allergies  Family History  Problem Relation Age of Onset  . Coronary artery disease    . Hypertension      History   Social History  . Marital Status: Married    Spouse Name: N/A    Number of Children: N/A  . Years of Education: N/A   Occupational History  . Not on file.   Social History Main Topics  . Smoking status: Never Smoker   . Smokeless tobacco: Not on  file  . Alcohol Use: No  . Drug Use: No  . Sexually Active:    Other Topics Concern  . Not on file   Social History Narrative  . No narrative on file    ROS ALL NEGATIVE EXCEPT THOSE NOTED IN HPI  PE  General Appearance: well developed, well nourished in no acute distress HEENT: symmetrical face, PERRLA, good dentition  Neck: no JVD, thyromegaly, or adenopathy, trachea midline Chest: symmetric without deformity Cardiac: PMI non-displaced, RRR, normal S1, S2, no gallop or murmur Lung: clear to ausculation and percussion Vascular: all pulses full without bruits  Abdominal: nondistended, nontender, good bowel sounds, no HSM, no bruits Extremities: no cyanosis, clubbing or edema, no sign of DVT, no varicosities  Skin: normal color, no rashes Neuro: alert and oriented x 3, non-focal Pysch: normal affect  EKG Normal sinus rhythm, nonspecific T wave changes. BMET    Component Value Date/Time   NA 136 04/14/2012 1214   K 3.4* 04/14/2012 1214   CL 98 04/14/2012 1214   CO2 26 04/14/2012 1214   GLUCOSE 277* 04/14/2012 1214   BUN 18 04/14/2012 1214  CREATININE 0.93 04/14/2012 1214   CALCIUM 9.5 04/14/2012 1214   GFRNONAA 57* 04/14/2012 1214   GFRAA 66* 04/14/2012 1214    Lipid Panel  No results found for this basename: chol, trig, hdl, cholhdl, vldl, ldlcalc    CBC    Component Value Date/Time   WBC 9.7 04/14/2012 1214   RBC 4.81 04/14/2012 1214   HGB 14.5 04/14/2012 1214   HCT 42.1 04/14/2012 1214   PLT 195 04/14/2012 1214   MCV 87.5 04/14/2012 1214   MCH 30.1 04/14/2012 1214   MCHC 34.4 04/14/2012 1214   RDW 12.9 04/14/2012 1214   LYMPHSABS 1.6 04/14/2012 1214   MONOABS 0.9 04/14/2012 1214   EOSABS 0.1 04/14/2012 1214   BASOSABS 0.1 04/14/2012 1214

## 2012-05-21 ENCOUNTER — Ambulatory Visit (HOSPITAL_COMMUNITY)
Admission: RE | Admit: 2012-05-21 | Discharge: 2012-05-21 | Disposition: A | Discharge status: Home / Self Care | Payer: Medicare Other | Source: Ambulatory Visit | Attending: Pulmonary Disease | Admitting: Pulmonary Disease

## 2012-05-21 ENCOUNTER — Other Ambulatory Visit (HOSPITAL_COMMUNITY): Payer: Self-pay | Admitting: Pulmonary Disease

## 2012-05-21 DIAGNOSIS — M79671 Pain in right foot: Secondary | ICD-10-CM

## 2012-05-21 DIAGNOSIS — M7989 Other specified soft tissue disorders: Secondary | ICD-10-CM | POA: Insufficient documentation

## 2012-05-21 DIAGNOSIS — M7730 Calcaneal spur, unspecified foot: Secondary | ICD-10-CM | POA: Insufficient documentation

## 2012-05-21 DIAGNOSIS — M79609 Pain in unspecified limb: Secondary | ICD-10-CM | POA: Insufficient documentation

## 2012-06-14 ENCOUNTER — Other Ambulatory Visit: Payer: Self-pay | Admitting: *Deleted

## 2012-06-14 MED ORDER — METOPROLOL TARTRATE 50 MG PO TABS: 50.0000 mg | ORAL_TABLET | Freq: Two times a day (BID) | ORAL | Status: DC

## 2012-10-20 ENCOUNTER — Telehealth: Payer: Self-pay | Admitting: Cardiology

## 2012-10-20 NOTE — Telephone Encounter (Signed)
New Problem:    Patient called in wanting to speak with a nurse concerning her medications.  Please call back.

## 2012-10-20 NOTE — Telephone Encounter (Signed)
Spoke with pt, her blood sugars are running high. She discussed it with dr Juanetta Gosling her PCP and they told her she would need to see a specialist. She would like to see someone connected with [facility]. appt made for pt to see dr Elvera Lennox 11/02/11 @ 1 pm. Pt made aware.

## 2012-11-01 ENCOUNTER — Ambulatory Visit: Payer: Medicare Other | Admitting: Internal Medicine

## 2012-11-08 ENCOUNTER — Ambulatory Visit: Payer: Medicare Other | Admitting: Internal Medicine

## 2012-11-10 ENCOUNTER — Ambulatory Visit: Payer: Medicare Other | Admitting: Internal Medicine

## 2013-02-01 ENCOUNTER — Other Ambulatory Visit: Payer: Self-pay | Admitting: *Deleted

## 2013-02-01 MED ORDER — NITROGLYCERIN 0.4 MG SL SUBL: 0.4000 mg | SUBLINGUAL_TABLET | SUBLINGUAL | Status: DC | PRN

## 2013-02-01 NOTE — Telephone Encounter (Signed)
REFILL SENT 

## 2013-02-18 ENCOUNTER — Emergency Department (HOSPITAL_COMMUNITY): Payer: Medicare Other

## 2013-02-18 ENCOUNTER — Inpatient Hospital Stay (HOSPITAL_COMMUNITY)
Admission: EM | Admit: 2013-02-18 | Discharge: 2013-02-20 | DRG: 287 | Disposition: A | Discharge status: Home / Self Care | Payer: Medicare Other | Attending: Cardiovascular Disease | Admitting: Cardiovascular Disease

## 2013-02-18 ENCOUNTER — Encounter (HOSPITAL_COMMUNITY): Payer: Self-pay

## 2013-02-18 DIAGNOSIS — I2510 Atherosclerotic heart disease of native coronary artery without angina pectoris: Principal | ICD-10-CM | POA: Diagnosis present

## 2013-02-18 DIAGNOSIS — E785 Hyperlipidemia, unspecified: Secondary | ICD-10-CM | POA: Diagnosis present

## 2013-02-18 DIAGNOSIS — E119 Type 2 diabetes mellitus without complications: Secondary | ICD-10-CM | POA: Diagnosis present

## 2013-02-18 DIAGNOSIS — Z9089 Acquired absence of other organs: Secondary | ICD-10-CM

## 2013-02-18 DIAGNOSIS — IMO0001 Reserved for inherently not codable concepts without codable children: Secondary | ICD-10-CM | POA: Diagnosis present

## 2013-02-18 DIAGNOSIS — R079 Chest pain, unspecified: Secondary | ICD-10-CM

## 2013-02-18 DIAGNOSIS — Z7982 Long term (current) use of aspirin: Secondary | ICD-10-CM

## 2013-02-18 DIAGNOSIS — I200 Unstable angina: Secondary | ICD-10-CM

## 2013-02-18 DIAGNOSIS — E7800 Pure hypercholesterolemia, unspecified: Secondary | ICD-10-CM | POA: Diagnosis present

## 2013-02-18 DIAGNOSIS — R739 Hyperglycemia, unspecified: Secondary | ICD-10-CM

## 2013-02-18 DIAGNOSIS — Z8249 Family history of ischemic heart disease and other diseases of the circulatory system: Secondary | ICD-10-CM

## 2013-02-18 DIAGNOSIS — Z794 Long term (current) use of insulin: Secondary | ICD-10-CM

## 2013-02-18 DIAGNOSIS — Z9889 Other specified postprocedural states: Secondary | ICD-10-CM

## 2013-02-18 DIAGNOSIS — Z86711 Personal history of pulmonary embolism: Secondary | ICD-10-CM

## 2013-02-18 DIAGNOSIS — E782 Mixed hyperlipidemia: Secondary | ICD-10-CM | POA: Diagnosis present

## 2013-02-18 DIAGNOSIS — Z9861 Coronary angioplasty status: Secondary | ICD-10-CM

## 2013-02-18 DIAGNOSIS — I10 Essential (primary) hypertension: Secondary | ICD-10-CM | POA: Diagnosis present

## 2013-02-18 DIAGNOSIS — Z79899 Other long term (current) drug therapy: Secondary | ICD-10-CM

## 2013-02-18 DIAGNOSIS — E039 Hypothyroidism, unspecified: Secondary | ICD-10-CM | POA: Diagnosis present

## 2013-02-18 LAB — COMPREHENSIVE METABOLIC PANEL
Albumin: 3.1 g/dL — ABNORMAL LOW (ref 3.5–5.2)
Alkaline Phosphatase: 98 U/L (ref 39–117)
BUN: 19 mg/dL (ref 6–23)
CO2: 21 mEq/L (ref 19–32)
Chloride: 104 mEq/L (ref 96–112)
Creatinine, Ser: 0.91 mg/dL (ref 0.50–1.10)
GFR calc non Af Amer: 58 mL/min — ABNORMAL LOW (ref >90)
Glucose, Bld: 231 mg/dL — ABNORMAL HIGH (ref 70–99)
Potassium: 3.5 mEq/L (ref 3.5–5.1)
Total Bilirubin: 0.2 mg/dL — ABNORMAL LOW (ref 0.3–1.2)

## 2013-02-18 LAB — CBC WITH DIFFERENTIAL/PLATELET
Basophils Relative: 1 % (ref 0–1)
Eosinophils Absolute: 0.2 10*3/uL (ref 0.0–0.7)
HCT: 42.7 % (ref 36.0–46.0)
HCT: 37.6 % (ref 36.0–46.0)
Hemoglobin: 13 g/dL (ref 12.0–15.0)
Lymphocytes Relative: 24 % (ref 12–46)
Lymphocytes Relative: 31 % (ref 12–46)
Lymphs Abs: 2.4 10*3/uL (ref 0.7–4.0)
Lymphs Abs: 2.5 10*3/uL (ref 0.7–4.0)
MCHC: 34.6 g/dL (ref 30.0–36.0)
Monocytes Absolute: 1 10*3/uL (ref 0.1–1.0)
Monocytes Absolute: 0.6 10*3/uL (ref 0.1–1.0)
Monocytes Relative: 10 % (ref 3–12)
Monocytes Relative: 7 % (ref 3–12)
Neutro Abs: 6.2 10*3/uL (ref 1.7–7.7)
Neutro Abs: 4.6 10*3/uL (ref 1.7–7.7)
Neutrophils Relative %: 58 % (ref 43–77)
RBC: 4.23 MIL/uL (ref 3.87–5.11)
RDW: 13.3 % (ref 11.5–15.5)
WBC: 9.9 10*3/uL (ref 4.0–10.5)

## 2013-02-18 LAB — HEPARIN LEVEL (UNFRACTIONATED): Heparin Unfractionated: 0.2 IU/mL — ABNORMAL LOW (ref 0.30–0.70)

## 2013-02-18 LAB — BASIC METABOLIC PANEL
CO2: 27 mEq/L (ref 19–32)
Calcium: 9.5 mg/dL (ref 8.4–10.5)
Creatinine, Ser: 0.97 mg/dL (ref 0.50–1.10)
GFR calc non Af Amer: 54 mL/min — ABNORMAL LOW (ref >90)
Glucose, Bld: 220 mg/dL — ABNORMAL HIGH (ref 70–99)
Sodium: 139 mEq/L (ref 135–145)

## 2013-02-18 LAB — GLUCOSE, CAPILLARY
Glucose-Capillary: 225 mg/dL — ABNORMAL HIGH (ref 70–99)
Glucose-Capillary: 195 mg/dL — ABNORMAL HIGH (ref 70–99)
Glucose-Capillary: 94 mg/dL (ref 70–99)

## 2013-02-18 LAB — TSH: TSH: 0.716 u[IU]/mL (ref 0.350–4.500)

## 2013-02-18 MED ORDER — TRIAMTERENE-HCTZ 75-50 MG PO TABS
1.0000 | ORAL_TABLET | Freq: Every day | ORAL | Status: DC
  Administered 2013-02-18 – 2013-02-20 (×3): 1 via ORAL
  Filled 2013-02-18 (×3): qty 1

## 2013-02-18 MED ORDER — ASPIRIN 325 MG PO TABS: 325.0000 mg | ORAL_TABLET | Freq: Once | ORAL | Status: DC

## 2013-02-18 MED ORDER — ASPIRIN 81 MG PO CHEW
324.0000 mg | CHEWABLE_TABLET | ORAL | Status: AC
  Administered 2013-02-20: 324 mg via ORAL
  Filled 2013-02-18: qty 4

## 2013-02-18 MED ORDER — MORPHINE SULFATE 4 MG/ML IJ SOLN
4.0000 mg | Freq: Once | INTRAMUSCULAR | Status: AC
  Administered 2013-02-18: 4 mg via INTRAVENOUS
  Filled 2013-02-18: qty 1

## 2013-02-18 MED ORDER — ASPIRIN 81 MG PO CHEW
324.0000 mg | CHEWABLE_TABLET | Freq: Once | ORAL | Status: AC
  Administered 2013-02-18: 324 mg via ORAL
  Filled 2013-02-18: qty 4

## 2013-02-18 MED ORDER — LEVOTHYROXINE SODIUM 175 MCG PO TABS
175.0000 ug | ORAL_TABLET | Freq: Every day | ORAL | Status: DC
  Administered 2013-02-19 – 2013-02-20 (×2): 175 ug via ORAL
  Filled 2013-02-18 (×3): qty 1

## 2013-02-18 MED ORDER — ACETAMINOPHEN 325 MG PO TABS
650.0000 mg | ORAL_TABLET | Freq: Once | ORAL | Status: AC
  Administered 2013-02-18: 650 mg via ORAL

## 2013-02-18 MED ORDER — ASPIRIN 81 MG PO CHEW: 324.0000 mg | CHEWABLE_TABLET | Freq: Once | ORAL | Status: DC

## 2013-02-18 MED ORDER — HEPARIN (PORCINE) IN NACL 100-0.45 UNIT/ML-% IJ SOLN
850.0000 [IU]/h | INTRAMUSCULAR | Status: DC
  Administered 2013-02-18: 850 [IU]/h via INTRAVENOUS
  Filled 2013-02-18: qty 250

## 2013-02-18 MED ORDER — ASPIRIN 300 MG RE SUPP: 300.0000 mg | RECTAL | Status: DC

## 2013-02-18 MED ORDER — NITROGLYCERIN 0.4 MG SL SUBL: 0.4000 mg | SUBLINGUAL_TABLET | SUBLINGUAL | Status: DC | PRN

## 2013-02-18 MED ORDER — ASPIRIN EC 81 MG PO TBEC: 81.0000 mg | DELAYED_RELEASE_TABLET | Freq: Every day | ORAL | Status: DC

## 2013-02-18 MED ORDER — ACETAMINOPHEN 325 MG PO TABS
650.0000 mg | ORAL_TABLET | ORAL | Status: DC | PRN
  Administered 2013-02-19: 650 mg via ORAL
  Filled 2013-02-18 (×2): qty 2

## 2013-02-18 MED ORDER — INSULIN GLARGINE 100 UNIT/ML ~~LOC~~ SOLN
80.0000 [IU] | Freq: Every day | SUBCUTANEOUS | Status: DC
  Administered 2013-02-18 – 2013-02-19 (×2): 80 [IU] via SUBCUTANEOUS
  Filled 2013-02-18 (×3): qty 0.8

## 2013-02-18 MED ORDER — HEPARIN BOLUS VIA INFUSION
4000.0000 [IU] | Freq: Once | INTRAVENOUS | Status: AC
  Administered 2013-02-18: 4000 [IU] via INTRAVENOUS

## 2013-02-18 MED ORDER — HEPARIN (PORCINE) IN NACL 100-0.45 UNIT/ML-% IJ SOLN
900.0000 [IU]/h | INTRAMUSCULAR | Status: DC
  Administered 2013-02-19 – 2013-02-20 (×2): 1050 [IU]/h via INTRAVENOUS
  Filled 2013-02-18 (×4): qty 250

## 2013-02-18 MED ORDER — FENOFIBRATE 160 MG PO TABS
160.0000 mg | ORAL_TABLET | Freq: Every day | ORAL | Status: DC
  Administered 2013-02-18 – 2013-02-20 (×3): 160 mg via ORAL
  Filled 2013-02-18 (×3): qty 1

## 2013-02-18 MED ORDER — SODIUM CHLORIDE 0.9 % IJ SOLN
3.0000 mL | Freq: Two times a day (BID) | INTRAMUSCULAR | Status: DC
  Administered 2013-02-18: 3 mL via INTRAVENOUS
  Administered 2013-02-18 – 2013-02-19 (×2): via INTRAVENOUS

## 2013-02-18 MED ORDER — SODIUM CHLORIDE 0.9 % IJ SOLN: 3.0000 mL | INTRAMUSCULAR | Status: DC | PRN

## 2013-02-18 MED ORDER — SODIUM CHLORIDE 0.9 % IV SOLN: 250.0000 mL | INTRAVENOUS | Status: DC | PRN

## 2013-02-18 MED ORDER — ASPIRIN EC 81 MG PO TBEC
81.0000 mg | DELAYED_RELEASE_TABLET | Freq: Every day | ORAL | Status: DC
  Administered 2013-02-19: 81 mg via ORAL
  Filled 2013-02-18 (×2): qty 1

## 2013-02-18 MED ORDER — ATORVASTATIN CALCIUM 80 MG PO TABS
80.0000 mg | ORAL_TABLET | Freq: Every day | ORAL | Status: DC
  Administered 2013-02-18 – 2013-02-19 (×2): 80 mg via ORAL
  Filled 2013-02-18 (×4): qty 1

## 2013-02-18 MED ORDER — INSULIN ASPART 100 UNIT/ML ~~LOC~~ SOLN
15.0000 [IU] | Freq: Three times a day (TID) | SUBCUTANEOUS | Status: DC
  Administered 2013-02-18 – 2013-02-19 (×3): 15 [IU] via SUBCUTANEOUS

## 2013-02-18 MED ORDER — ALPRAZOLAM 0.25 MG PO TABS
0.2500 mg | ORAL_TABLET | Freq: Every evening | ORAL | Status: DC | PRN
  Administered 2013-02-18 – 2013-02-19 (×2): 0.25 mg via ORAL
  Filled 2013-02-18 (×2): qty 1

## 2013-02-18 MED ORDER — ONDANSETRON HCL 4 MG/2ML IJ SOLN: 4.0000 mg | Freq: Four times a day (QID) | INTRAMUSCULAR | Status: DC | PRN

## 2013-02-18 MED ORDER — ASPIRIN 81 MG PO CHEW: 324.0000 mg | CHEWABLE_TABLET | ORAL | Status: DC

## 2013-02-18 MED ORDER — ZOLPIDEM TARTRATE 5 MG PO TABS
5.0000 mg | ORAL_TABLET | Freq: Every evening | ORAL | Status: DC | PRN
  Administered 2013-02-19: 5 mg via ORAL
  Filled 2013-02-18: qty 1

## 2013-02-18 MED ORDER — METOPROLOL TARTRATE 50 MG PO TABS
50.0000 mg | ORAL_TABLET | Freq: Two times a day (BID) | ORAL | Status: DC
  Administered 2013-02-18 – 2013-02-20 (×5): 50 mg via ORAL
  Filled 2013-02-18 (×6): qty 1

## 2013-02-18 MED ORDER — ACETAMINOPHEN 325 MG PO TABS
650.0000 mg | ORAL_TABLET | Freq: Once | ORAL | Status: AC
  Administered 2013-02-18: 650 mg via ORAL
  Filled 2013-02-18: qty 2

## 2013-02-18 MED ORDER — NITROGLYCERIN 0.4 MG SL SUBL
0.4000 mg | SUBLINGUAL_TABLET | SUBLINGUAL | Status: DC | PRN
  Administered 2013-02-18 (×2): 0.4 mg via SUBLINGUAL
  Filled 2013-02-18: qty 25

## 2013-02-18 NOTE — ED Notes (Signed)
Pt reports that she woke from sleep at 2am today "covered in sweat and having left side chest pain", she took 3 of her ntg and felt better, and just laid there, pain is now worse so she came to the ed. +nasuea, +dizzy, +sob at times. Also has headache and skin feels numb all over (since taking ntg)

## 2013-02-18 NOTE — Progress Notes (Signed)
ANTICOAGULATION CONSULT NOTE - Initial Consult  Pharmacy Consult for Heparin Indication: chest pain/ACS  No Known Allergies  Patient Measurements: Height: 5' 3.5" (161.3 cm) Weight: 158 lb (71.668 kg) IBW/kg (Calculated) : 53.55 Heparin Dosing Weight: 71.7 kg  Vital Signs: Temp: 98 F (36.7 C) (07/12 0723) Temp src: Oral (07/12 0723) BP: 111/85 mmHg (07/12 0859) Pulse Rate: 83 (07/12 0859)  Labs:  Recent Labs  02/18/13 0718  HGB 14.6  HCT 42.7  PLT 199  CREATININE 0.97  TROPONINI <0.30    Estimated Creatinine Clearance: 45.1 ml/min (by C-G formula based on Cr of 0.97).   Medical History: Past Medical History  Diagnosis Date  . Acute MI   . Diabetes mellitus   . Thyroid disease   . Hypertension   . High cholesterol   . Chest pain   . CAD (coronary artery disease)   . Palpitations   . Hyperlipidemia   . PE (pulmonary embolism)     Medications:  Scheduled:    Assessment: Reviewed PTA medications Reviewed labs Heparin for ACS / chest pain, start at 12 units/kg/hour   Goal of Therapy:  Heparin level 0.3-0.7 units/ml Monitor platelets by anticoagulation protocol: Yes   Plan:  Give 4000 units bolus x 1 Start heparin infusion at 850 units/hr Check anti-Xa level in 8 hours and daily while on heparin Continue to monitor H&H and platelets  Raquel James, Zuriel Yeaman Bennett 02/18/2013,9:20 AM

## 2013-02-18 NOTE — Progress Notes (Signed)
CARDIOLOGY ADMISSION NOTE  Patient ID: [name] [name] MRN: [id number] DOB/AGE: July 20, 1932 79 y.o.  Admit date: 02/18/2013 Primary Physician   Fredirick Maudlin, MD Primary Cardiologist   Dr. Daleen Squibb Chief Complaint    Chest pain  HPI:  The patient was in her usual state of health. She does some household chores and hasn't been having any symptoms with this. However, this morning she woke up at 6 AM with her in her chest. It was mid and around her entire left chest. It was 10 out 10. He was diaphoretic. He couldn't take a deep breath. She did not have nausea or vomiting. It was similar to her previous anginal pain. She took some nitroglycerin and had some improvement but he didn't go away. Her husband drove her to the emergency room.  She was treated with heparin, ASA, morphine and SLNTG.  Initial enzymes were negative and EKG was nonacute. Her pain is currently a 2/10. She's not radiation to her jaw or arms.  Her past history includes CAD.  She had a DES to the RCA in 2009 by Dr. Excell Seltzer.  Previous cath demonstrated anatomy as described below. Her last stress test was 2011 with [facility] demonstrating an EF of 82% with no evidence of ischemia or infarct. August 24,  She has had a history of PE.  She was in the ER in Sept for chest pain.  CT was negative for evidence of pulmonary embolism.     Past Medical History  Diagnosis Date  . Diabetes mellitus   . Thyroid disease   . Hypertension   . CAD (coronary artery disease)     DES tor RCA 2009.  75% ostial first diagonal stenosis, 30% proximal LAD stenosis, 85% proximal right coronary artery stenosis treated with Promus 3.5 x 18 stent.  . Palpitations   . Hyperlipidemia   . PE (pulmonary embolism)     Past Surgical History  Procedure Laterality Date  . Abdominal hysterectomy    . Cholecystectomy    . Tonsillectomy    . Appendectomy    . Abdominal cavity operation      Obstruction    No Known Allergies No current  facility-administered medications on file prior to encounter.   Current Outpatient Prescriptions on File Prior to Encounter  Medication Sig Dispense Refill  . amiloride-hydrochlorothiazide (MODURETIC) 5-50 MG tablet Take 1 tablet by mouth daily.        Marland Kitchen aspirin EC 81 MG tablet Take 81 mg by mouth daily.      . fenofibrate (TRICOR) 145 MG tablet Take 145 mg by mouth daily.       . insulin aspart (NOVOLOG) 100 UNIT/ML injection Inject 15 Units into the skin 3 (three) times daily after meals.       . insulin glargine (LANTUS) 100 UNIT/ML injection Inject 80 Units into the skin at bedtime.       Marland Kitchen levothyroxine (SYNTHROID, LEVOTHROID) 175 MCG tablet Take 175 mcg by mouth daily.       . metoprolol (LOPRESSOR) 50 MG tablet Take 1 tablet (50 mg total) by mouth 2 (two) times daily.  60 tablet  12  . nitroGLYCERIN (NITROSTAT) 0.4 MG SL tablet Place 1 tablet (0.4 mg total) under the tongue every 5 (five) minutes x 3 doses as needed.  25 tablet  3  . rosuvastatin (CRESTOR) 40 MG tablet Take 80 mg by mouth at bedtime.        History   Social History  . Marital  Status: Married    Spouse Name: N/A    Number of Children: 0  . Years of Education: N/A   Occupational History  .     Social History Main Topics  . Smoking status: Never Smoker   . Smokeless tobacco: Not on file  . Alcohol Use: No  . Drug Use: No  . Sexually Active: No   Other Topics Concern  . Not on file   Social History Narrative   Lives with husband.      Family History  Problem Relation Age of Onset  . Coronary artery disease    . Hypertension      ROS:  As stated in the HPI and negative for all other systems.  Physical Exam: Blood pressure 164/55, pulse 72, temperature 99.3 F (37.4 C), temperature source Oral, resp. rate 18, height 5' 3.5" (1.613 m), weight 168 lb 3.4 oz (76.3 kg), SpO2 100.00%.  GENERAL:  Well appearing HEENT:  Pupils equal round and reactive, fundi not visualized, oral mucosa unremarkable NECK:   No jugular venous distention, waveform within normal limits, carotid upstroke brisk and symmetric, no bruits, no thyromegaly LYMPHATICS:  No cervical, inguinal adenopathy LUNGS:  Clear to auscultation bilaterally BACK:  No CVA tenderness CHEST:  Unremarkable HEART:  PMI not displaced or sustained,S1 and S2 within normal limits, no S3, no S4, no clicks, no rubs, no murmurs ABD:  Flat, positive bowel sounds normal in frequency in pitch, no bruits, no rebound, no guarding, no midline pulsatile mass, no hepatomegaly, no splenomegaly EXT:  2 plus pulses throughout, no edema, no cyanosis no clubbing SKIN:  No rashes no nodules NEURO:  Cranial nerves II through XII grossly intact, motor grossly intact throughout PSYCH:  Cognitively intact, oriented to person place and time  Labs: Lab Results  Component Value Date   BUN 24* 02/18/2013   Lab Results  Component Value Date   CREATININE 0.97 02/18/2013   Lab Results  Component Value Date   NA 139 02/18/2013   K 3.7 02/18/2013   CL 101 02/18/2013   CO2 27 02/18/2013   Lab Results  Component Value Date   TROPONINI <0.30 02/18/2013   Lab Results  Component Value Date   WBC 9.9 02/18/2013   HGB 14.6 02/18/2013   HCT 42.7 02/18/2013   MCV 89.9 02/18/2013   PLT 199 02/18/2013     Radiology:  CXR:  No active disease.  EKG:  NSR, rate 89, axis WNL, intervals WNL, PACs, no acute ST T wave changes.   ASSESSMENT AND PLAN:    CHEST PAIN:  Pretest probability of obstructive coronary disease as the etiology of her pain is high. Therefore, she will have cardiac catheterization.  The patient understands that risks included but are not limited to stroke (1 in 1000), death (1 in 1000), kidney failure [usually temporary] (1 in 500), bleeding (1 in 200), allergic reaction [possibly serious] (1 in 200).  The patient understands and agrees to proceed.   DIABETES:  Continue current therapy.  Check A1c  HTN:  BP slightly elevated.  Continue meds as listed with  adjustments as needed.  HISTORY OF DVT/PE:  There is no suggestion of pulmonary embolism. No further workup is suggested.   SignedRollene Rotunda 02/18/2013, 2:29 PM

## 2013-02-18 NOTE — Progress Notes (Signed)
ANTICOAGULATION CONSULT NOTE - Follow-Up Consult  Pharmacy Consult for Heparin Indication: chest pain/ACS  No Known Allergies  Patient Measurements: Height: 5' 3.5" (161.3 cm) Weight: 168 lb 3.4 oz (76.3 kg) IBW/kg (Calculated) : 53.55 Heparin Dosing Weight: 71.7 kg  Vital Signs: Temp: 98.9 F (37.2 C) (07/12 1600) Temp src: Oral (07/12 1600) BP: 143/43 mmHg (07/12 1600) Pulse Rate: 78 (07/12 1600)  Labs:  Recent Labs  02/18/13 0718 02/18/13 0909 02/18/13 1608 02/18/13 1841  HGB 14.6  --  13.0  --   HCT 42.7  --  37.6  --   PLT 199  --  170  --   APTT  --  25  --   --   HEPARINUNFRC  --   --   --  0.20*  CREATININE 0.97  --  0.91  --   TROPONINI <0.30  --   --   --     Estimated Creatinine Clearance: 49.6 ml/min (by C-G formula based on Cr of 0.91).   Medical History: Past Medical History  Diagnosis Date  . Diabetes mellitus   . Thyroid disease   . Hypertension   . CAD (coronary artery disease)     DES tor RCA 2009.  75% ostial first diagonal stenosis, 30% proximal LAD stenosis, 85% proximal right coronary artery stenosis treated with Promus 3.5 x 18 stent.  . Palpitations   . Hyperlipidemia   . PE (pulmonary embolism)     Medications:  Scheduled:  . [START ON 02/19/2013] aspirin  324 mg Oral Pre-Cath  . [START ON 02/19/2013] aspirin EC  81 mg Oral Daily  . atorvastatin  80 mg Oral q1800  . fenofibrate  160 mg Oral Daily  . insulin aspart  15 Units Subcutaneous TID PC  . insulin glargine  80 Units Subcutaneous QHS  . [START ON 02/19/2013] levothyroxine  175 mcg Oral QAC breakfast  . metoprolol  50 mg Oral BID  . sodium chloride  3 mL Intravenous Q12H  . triamterene-hydrochlorothiazide  1 tablet Oral Daily    Assessment: 79 yo female admitted with chest pain, PMH significant for DM, thyroid dx, HTN, CAD, hyperlipidemia, PE.  Started on IV heparin at 850 units/hr.  Initial heparin level slightly below goal.  No bleeding or complications noted per chart  notes.    Goal of Therapy:  Heparin level 0.3-0.7 units/ml Monitor platelets by anticoagulation protocol: Yes   Plan:  1. Increase IV heparin gtt to 1050 units/hr. 2. Check heparin level in 8 hrs with AM labs. 3. Daily heparin level and CBC. 4. F/u timing of cath lab.  Tad Moore, BCPS  Clinical Pharmacist Pager [phone]  02/18/2013 7:52 PM

## 2013-02-18 NOTE — ED Provider Notes (Signed)
History  This chart was scribed for Joya Gaskins, MD by Bennett Scrape, ED Scribe. This patient was seen in room APA02/[facility]02 and the patient's care was started at 7:03 AM.  CSN: [id number]  Arrival date & time 02/18/13  0700   First MD Initiated Contact with Patient 02/18/13 0703     Chief Complaint  Patient presents with  . Chest Pain    Patient is a 79 y.o. female presenting with chest pain. The history is provided by the patient. No language interpreter was used.  Chest Pain Pain location:  L chest Pain radiates to:  Does not radiate Pain severity:  Moderate Duration:  5 hours Timing:  Constant Progression:  Improving Chronicity:  New Context: at rest   Relieved by:  Nitroglycerin Associated symptoms: dizziness, headache (from NTG), nausea and shortness of breath   Associated symptoms: no back pain, no cough and not vomiting   Risk factors: diabetes mellitus, high cholesterol, hypertension and prior DVT/PE   Risk factors: no smoking     HPI Comments: [name] [name] is a 79 y.o. female who presents to the Emergency Department complaining of left-sided CP that woke her from sleep this morning at 2 AM with associated nausea, SOB and dizziness. She denies radiation into her abdomen or back. She states that the pain improved mildly with 3 NTG and she currently rates her pain an 8 out of 10. She reports having similar but milder CP 2 days ago after walking to the mailbox and admits that this was a new symptom for her. She states that she felt weak yesterday but denies having any pain. She has a h/o stent placement 5 years ago but denies having any ongoing agina. She denies syncope, hematochezia and emesis as associated symptoms. She denies taking any ASA today. She has a h/o HTN, HLD and DM. Pt denies smoking and alcohol use.  Cardiologist is Dr. Excell Seltzer PCP is Dr. Juanetta Gosling  Past Medical History  Diagnosis Date  . Acute MI   . Diabetes mellitus   . Thyroid disease   .  Hypertension   . High cholesterol   . Chest pain   . CAD (coronary artery disease)   . Palpitations   . Hyperlipidemia   . PE (pulmonary embolism)    Past Surgical History  Procedure Laterality Date  . Coronary angioplasty with stent placement    . Abdominal hysterectomy    . Cholecystectomy    . Tonsillectomy    . Appendectomy    . Abdominal cavity operation     Family History  Problem Relation Age of Onset  . Coronary artery disease    . Hypertension     History  Substance Use Topics  . Smoking status: Never Smoker   . Smokeless tobacco: Not on file  . Alcohol Use: No   No OB history provided.   Review of Systems  Respiratory: Positive for shortness of breath. Negative for cough.   Cardiovascular: Positive for chest pain. Negative for leg swelling.  Gastrointestinal: Positive for nausea. Negative for vomiting and diarrhea.  Musculoskeletal: Negative for back pain.  Neurological: Positive for dizziness and headaches (from NTG). Negative for syncope.  All other systems reviewed and are negative.    Allergies  Review of patient's allergies indicates no known allergies.  Home Medications   Current Outpatient Rx  Name  Route  Sig  Dispense  Refill  . amiloride-hydrochlorothiazide (MODURETIC) 5-50 MG tablet   Oral   Take 1 tablet by  mouth daily.           Marland Kitchen aspirin EC 81 MG tablet   Oral   Take 81 mg by mouth daily.         . fenofibrate (TRICOR) 145 MG tablet   Oral   Take 145 mg by mouth daily.          . insulin aspart (NOVOLOG) 100 UNIT/ML injection   Subcutaneous   Inject 15 Units into the skin 3 (three) times daily after meals.          . insulin glargine (LANTUS) 100 UNIT/ML injection   Subcutaneous   Inject 80 Units into the skin at bedtime.          Marland Kitchen levothyroxine (SYNTHROID, LEVOTHROID) 175 MCG tablet   Oral   Take 175 mcg by mouth daily.          . metoprolol (LOPRESSOR) 50 MG tablet   Oral   Take 1 tablet (50 mg total) by  mouth 2 (two) times daily.   60 tablet   12   . nitroGLYCERIN (NITROSTAT) 0.4 MG SL tablet   Sublingual   Place 1 tablet (0.4 mg total) under the tongue every 5 (five) minutes x 3 doses as needed.   25 tablet   3   . rosuvastatin (CRESTOR) 40 MG tablet   Oral   Take 80 mg by mouth at bedtime.           BP 153/56  Pulse 100  Temp(Src) 98 F (36.7 C) (Oral)  Resp 16  Ht 5' 3.5" (1.613 m)  Wt 158 lb (71.668 kg)  BMI 27.55 kg/m2  SpO2 95% BP 111/85  Pulse 83  Temp(Src) 98 F (36.7 C) (Oral)  Resp 14  Ht 5' 3.5" (1.613 m)  Wt 158 lb (71.668 kg)  BMI 27.55 kg/m2  SpO2 95%   Physical Exam  Nursing note and vitals reviewed.  CONSTITUTIONAL: Well developed/well nourished HEAD: Normocephalic/atraumatic EYES: EOMI/PERRL ENMT: Mucous membranes moist NECK: supple no meningeal signs SPINE:entire spine nontender CV: S1/S2 noted, no murmurs/rubs/gallops noted LUNGS: Lungs are clear to auscultation bilaterally, no apparent distress ABDOMEN: soft, nontender, no rebound or guarding GU:no cva tenderness NEURO: Pt is awake/alert, moves all extremitiesx4 EXTREMITIES: pulses normal, full ROM SKIN: warm, color normal PSYCH: no abnormalities of mood noted  ED Course  Procedures  CRITICAL CARE Performed by: Joya Gaskins Total critical care time: 35 Critical care time was exclusive of separately billable procedures and treating other patients. Critical care was necessary to treat or prevent imminent or life-threatening deterioration. Critical care was time spent personally by me on the following activities: development of treatment plan with patient and/or surrogate as well as nursing, discussions with consultants, evaluation of patient's response to treatment, examination of patient, obtaining history from patient or surrogate, ordering and performing treatments and interventions, ordering and review of laboratory studies, ordering and review of radiographic studies, pulse  oximetry and re-evaluation of patient's condition.   Medications  nitroGLYCERIN (NITROSTAT) SL tablet 0.4 mg (not administered)  aspirin chewable tablet 324 mg (not administered)    DIAGNOSTIC STUDIES: Oxygen Saturation is 97% on room air, normal by my interpretation.    COORDINATION OF CARE: 7:16 AM-Discussed treatment plan which includes ASA, CXR, CBC panel, BMP and troponin with pt at bedside and pt agreed to plan. Advised pt of possible admission which pt is agreeable to.  9:13 AM Pt reports pain improving. She did report increase in recent pain (CP with walking  to mailbox, new for her) as well as continuous pain since last night.  I am concerned for unstable angina.  I doubt dissection/PE I spoke to dr Gloris Manchester turner with cardiology She requests to start heparin Pt has been given NTG with some relief in pain.  Will start heparin for ACS/unstable angina  MDM  Nursing notes including past medical history and social history reviewed and considered in documentation xrays reviewed and considered Labs/vital reviewed and considered Previous records reviewed and considered - h/o CAD with stent placement      Date: 02/18/2013 0705am  Rate: 89  Rhythm: normal sinus rhythm  QRS Axis: left  Intervals: normal  ST/T Wave abnormalities: nonspecific ST changes  Conduction Disutrbances:none  Narrative Interpretation:   Old EKG Reviewed: unchanged from 04/2012     I personally performed the services described in this documentation, which was scribed in my presence. The recorded information has been reviewed and is accurate.         Joya Gaskins, MD 02/18/13 [phone]

## 2013-02-19 LAB — BASIC METABOLIC PANEL
CO2: 23 mEq/L (ref 19–32)
Calcium: 8.7 mg/dL (ref 8.4–10.5)
GFR calc non Af Amer: 59 mL/min — ABNORMAL LOW (ref >90)
Glucose, Bld: 110 mg/dL — ABNORMAL HIGH (ref 70–99)
Potassium: 3.8 mEq/L (ref 3.5–5.1)
Sodium: 138 mEq/L (ref 135–145)

## 2013-02-19 LAB — GLUCOSE, CAPILLARY
Glucose-Capillary: 101 mg/dL — ABNORMAL HIGH (ref 70–99)
Glucose-Capillary: 117 mg/dL — ABNORMAL HIGH (ref 70–99)

## 2013-02-19 LAB — CBC
HCT: 39.7 % (ref 36.0–46.0)
Platelets: 175 10*3/uL (ref 150–400)
RBC: 4.47 MIL/uL (ref 3.87–5.11)
RDW: 13.8 % (ref 11.5–15.5)
WBC: 9.4 10*3/uL (ref 4.0–10.5)

## 2013-02-19 LAB — LIPID PANEL
HDL: 31 mg/dL — ABNORMAL LOW (ref >39)
LDL Cholesterol: 10 mg/dL (ref 0–99)

## 2013-02-19 LAB — HEMOGLOBIN A1C
Hgb A1c MFr Bld: 9.5 % — ABNORMAL HIGH (ref <5.7)
Mean Plasma Glucose: 226 mg/dL — ABNORMAL HIGH (ref <117)

## 2013-02-19 LAB — HEPARIN LEVEL (UNFRACTIONATED): Heparin Unfractionated: 0.53 IU/mL (ref 0.30–0.70)

## 2013-02-19 NOTE — Progress Notes (Signed)
ANTICOAGULATION CONSULT NOTE - Follow-Up Consult  Pharmacy Consult for Heparin Indication: chest pain/ACS  No Known Allergies  Patient Measurements: Height: 5' 3.5" (161.3 cm) Weight: 168 lb 3.4 oz (76.3 kg) IBW/kg (Calculated) : 53.55 Heparin Dosing Weight: 71.7 kg  Vital Signs: Temp: 98.5 F (36.9 C) (07/13 1230) Temp src: Oral (07/13 1230) BP: 139/73 mmHg (07/13 1230) Pulse Rate: 82 (07/13 0454)  Labs:  Recent Labs  02/18/13 0718 02/18/13 0909 02/18/13 1608 02/18/13 1841 02/19/13 0335 02/19/13 1016 02/19/13 1423  HGB 14.6  --  13.0  --  13.4  --   --   HCT 42.7  --  37.6  --  39.7  --   --   PLT 199  --  170  --  175  --   --   APTT  --  25  --   --   --   --   --   HEPARINUNFRC  --   --   --  0.20* 0.53  --  0.68  CREATININE 0.97  --  0.91  --  0.90  --   --   TROPONINI <0.30  --   --   --   --  <0.30  --     Estimated Creatinine Clearance: 50.2 ml/min (by C-G formula based on Cr of 0.9).   Assessment: 79 yo female transferred [facility] with chest pain. Initial heparin level was below goal, and rate increased to 1050 units/hr. Check  this morning was therapeutic at 0.53 and confirmatory recheck was still therapeutic at 0.68 . CBC is all WNL and no bleeding noted.  Goal of Therapy:  Heparin level 0.3-0.7 units/ml Monitor platelets by anticoagulation protocol: Yes   Plan:  1. Continue IV heparin gtt at 1050 units/hr. 2. Daily heparin level and CBC. 3. F/u results of cath  Kimo Bancroft D. Jamaurie Bernier, PharmD Clinical Pharmacist Pager: [phone] 02/19/2013 3:14 PM

## 2013-02-19 NOTE — Progress Notes (Signed)
SUBJECTIVE:  No chest pain.  Some "tightness" but she feels much better since admission.   PHYSICAL EXAM Filed Vitals:   02/18/13 2100 02/18/13 2351 02/19/13 0454 02/19/13 0837  BP: 144/60 129/51 137/64 116/51  Pulse: 74 70 82   Temp:  97.9 F (36.6 C) 97.4 F (36.3 C) 98 F (36.7 C)  TempSrc:  Oral Oral Oral  Resp:  16 18   Height:      Weight:      SpO2: 95% 96% 98% 96%   General:  No distress Lungs:  Clear Heart:  RRR Abdomen:  Positive bowel sounds, no rebound no guarding Extremities:  No edema  LABS: Lab Results  Component Value Date   TROPONINI <0.30 02/18/2013   Results for orders placed during the hospital encounter of 02/18/13 (from the past 24 hour(s))  MRSA PCR SCREENING     Status: None   Collection Time    02/18/13  1:27 PM      Result Value Range   MRSA by PCR NEGATIVE  NEGATIVE  GLUCOSE, CAPILLARY     Status: Abnormal   Collection Time    02/18/13  3:35 PM      Result Value Range   Glucose-Capillary 195 (*) 70 - 99 mg/dL  HEMOGLOBIN R6E     Status: Abnormal   Collection Time    02/18/13  4:08 PM      Result Value Range   Hemoglobin A1C 9.5 (*) <5.7 %   Mean Plasma Glucose 226 (*) <117 mg/dL  TSH     Status: None   Collection Time    02/18/13  4:08 PM      Result Value Range   TSH 0.716  0.350 - 4.500 uIU/mL  CBC WITH DIFFERENTIAL     Status: None   Collection Time    02/18/13  4:08 PM      Result Value Range   WBC 7.9  4.0 - 10.5 K/uL   RBC 4.23  3.87 - 5.11 MIL/uL   Hemoglobin 13.0  12.0 - 15.0 g/dL   HCT 45.4  09.8 - 11.9 %   MCV 88.9  78.0 - 100.0 fL   MCH 30.7  26.0 - 34.0 pg   MCHC 34.6  30.0 - 36.0 g/dL   RDW 14.7  82.9 - 56.2 %   Platelets 170  150 - 400 K/uL   Neutrophils Relative % 58  43 - 77 %   Neutro Abs 4.6  1.7 - 7.7 K/uL   Lymphocytes Relative 31  12 - 46 %   Lymphs Abs 2.5  0.7 - 4.0 K/uL   Monocytes Relative 7  3 - 12 %   Monocytes Absolute 0.6  0.1 - 1.0 K/uL   Eosinophils Relative 3  0 - 5 %   Eosinophils  Absolute 0.2  0.0 - 0.7 K/uL   Basophils Relative 1  0 - 1 %   Basophils Absolute 0.0  0.0 - 0.1 K/uL  COMPREHENSIVE METABOLIC PANEL     Status: Abnormal   Collection Time    02/18/13  4:08 PM      Result Value Range   Sodium 138  135 - 145 mEq/L   Potassium 3.5  3.5 - 5.1 mEq/L   Chloride 104  96 - 112 mEq/L   CO2 21  19 - 32 mEq/L   Glucose, Bld 231 (*) 70 - 99 mg/dL   BUN 19  6 - 23 mg/dL   Creatinine, Ser 1.30  0.50 - 1.10 mg/dL   Calcium 8.6  8.4 - 78.2 mg/dL   Total Protein 6.2  6.0 - 8.3 g/dL   Albumin 3.1 (*) 3.5 - 5.2 g/dL   AST 19  0 - 37 U/L   ALT 15  0 - 35 U/L   Alkaline Phosphatase 98  39 - 117 U/L   Total Bilirubin 0.2 (*) 0.3 - 1.2 mg/dL   GFR calc non Af Amer 58 (*) >90 mL/min   GFR calc Af Amer 68 (*) >90 mL/min  GLUCOSE, CAPILLARY     Status: Abnormal   Collection Time    02/18/13  5:46 PM      Result Value Range   Glucose-Capillary 172 (*) 70 - 99 mg/dL  HEPARIN LEVEL (UNFRACTIONATED)     Status: Abnormal   Collection Time    02/18/13  6:41 PM      Result Value Range   Heparin Unfractionated 0.20 (*) 0.30 - 0.70 IU/mL  GLUCOSE, CAPILLARY     Status: None   Collection Time    02/18/13  8:29 PM      Result Value Range   Glucose-Capillary 94  70 - 99 mg/dL  GLUCOSE, CAPILLARY     Status: Abnormal   Collection Time    02/18/13 11:46 PM      Result Value Range   Glucose-Capillary 117 (*) 70 - 99 mg/dL  LIPID PANEL     Status: Abnormal   Collection Time    02/19/13  3:35 AM      Result Value Range   Cholesterol 111  0 - 200 mg/dL   Triglycerides 956 (*) <150 mg/dL   HDL 31 (*) >21 mg/dL   Total CHOL/HDL Ratio 3.6     VLDL 70 (*) 0 - 40 mg/dL   LDL Cholesterol 10  0 - 99 mg/dL  BASIC METABOLIC PANEL     Status: Abnormal   Collection Time    02/19/13  3:35 AM      Result Value Range   Sodium 138  135 - 145 mEq/L   Potassium 3.8  3.5 - 5.1 mEq/L   Chloride 105  96 - 112 mEq/L   CO2 23  19 - 32 mEq/L   Glucose, Bld 110 (*) 70 - 99 mg/dL   BUN 21   6 - 23 mg/dL   Creatinine, Ser 3.08  0.50 - 1.10 mg/dL   Calcium 8.7  8.4 - 65.7 mg/dL   GFR calc non Af Amer 59 (*) >90 mL/min   GFR calc Af Amer 69 (*) >90 mL/min  HEPARIN LEVEL (UNFRACTIONATED)     Status: None   Collection Time    02/19/13  3:35 AM      Result Value Range   Heparin Unfractionated 0.53  0.30 - 0.70 IU/mL  CBC     Status: None   Collection Time    02/19/13  3:35 AM      Result Value Range   WBC 9.4  4.0 - 10.5 K/uL   RBC 4.47  3.87 - 5.11 MIL/uL   Hemoglobin 13.4  12.0 - 15.0 g/dL   HCT 84.6  96.2 - 95.2 %   MCV 88.8  78.0 - 100.0 fL   MCH 30.0  26.0 - 34.0 pg   MCHC 33.8  30.0 - 36.0 g/dL   RDW 84.1  32.4 - 40.1 %   Platelets 175  150 - 400 K/uL  GLUCOSE, CAPILLARY     Status: Abnormal  Collection Time    02/19/13  4:57 AM      Result Value Range   Glucose-Capillary 101 (*) 70 - 99 mg/dL    Intake/Output Summary (Last 24 hours) at 02/19/13 1000 Last data filed at 02/19/13 0600  Gross per 24 hour  Intake 1193.97 ml  Output   1400 ml  Net -206.03 ml    EKG:  NSR, rate 72, no acute ST T wave changes. 02/19/2013  ASSESSMENT AND PLAN:  CHEST PAIN: Cath in AM.  Only one enzyme ordered.  I will cycle these.  No acute enzyme changes.   DIABETES: Continue current therapy.  A1c 9.5.  She will need further aggressive management as an out patient.  No triglycerides track the poorly controlled diabetes.   HTN: BP OK. Continue meds as listed with adjustments as needed.   HISTORY OF DVT/PE: There is no suggestion of pulmonary embolism. No further workup is suggested.   Fayrene Fearing Gurneet Matarese 02/19/2013 10:00 AM

## 2013-02-19 NOTE — Progress Notes (Signed)
ANTICOAGULATION CONSULT NOTE - Follow-Up Consult  Pharmacy Consult for Heparin Indication: chest pain/ACS  No Known Allergies  Patient Measurements: Height: 5' 3.5" (161.3 cm) Weight: 168 lb 3.4 oz (76.3 kg) IBW/kg (Calculated) : 53.55 Heparin Dosing Weight: 71.7 kg  Vital Signs: Temp: 97.4 F (36.3 C) (07/13 0454) Temp src: Oral (07/13 0454) BP: 137/64 mmHg (07/13 0454) Pulse Rate: 82 (07/13 0454)  Labs:  Recent Labs  02/18/13 0718 02/18/13 0909 02/18/13 1608 02/18/13 1841 02/19/13 0335  HGB 14.6  --  13.0  --  13.4  HCT 42.7  --  37.6  --  39.7  PLT 199  --  170  --  175  APTT  --  25  --   --   --   HEPARINUNFRC  --   --   --  0.20* 0.53  CREATININE 0.97  --  0.91  --  0.90  TROPONINI <0.30  --   --   --   --     Estimated Creatinine Clearance: 50.2 ml/min (by C-G formula based on Cr of 0.9).   Assessment: 79 yo female transferred [facility] with chest pain. Initial heparin level was below goal, and rate increased to 1050 units/hr. Recheck this morning was therapeutic at 0.53. CBC is all WNL and no bleeding noted.  Goal of Therapy:  Heparin level 0.3-0.7 units/ml Monitor platelets by anticoagulation protocol: Yes   Plan:  1. Continue IV heparin gtt at 1050 units/hr. 2. Check heparin level in 8 hrs 3. Daily heparin level and CBC. 4. F/u results of cath  Shaan Rhoads D. Tennyson Wacha, PharmD Clinical Pharmacist Pager: [phone] 02/19/2013 7:56 AM

## 2013-02-20 ENCOUNTER — Encounter (HOSPITAL_COMMUNITY)
Admission: EM | Disposition: A | Discharge status: Home / Self Care | Payer: Self-pay | Source: Home / Self Care | Attending: Cardiovascular Disease

## 2013-02-20 DIAGNOSIS — I2510 Atherosclerotic heart disease of native coronary artery without angina pectoris: Principal | ICD-10-CM

## 2013-02-20 DIAGNOSIS — R079 Chest pain, unspecified: Secondary | ICD-10-CM

## 2013-02-20 HISTORY — PX: LEFT HEART CATHETERIZATION WITH CORONARY ANGIOGRAM: SHX5451

## 2013-02-20 LAB — CBC
HCT: 39.2 % (ref 36.0–46.0)
MCH: 30 pg (ref 26.0–34.0)
MCV: 89.1 fL (ref 78.0–100.0)
RBC: 4.4 MIL/uL (ref 3.87–5.11)
WBC: 8.9 10*3/uL (ref 4.0–10.5)

## 2013-02-20 LAB — GLUCOSE, CAPILLARY: Glucose-Capillary: 95 mg/dL (ref 70–99)

## 2013-02-20 SURGERY — LEFT HEART CATHETERIZATION WITH CORONARY ANGIOGRAM
Anesthesia: LOCAL

## 2013-02-20 MED ORDER — SODIUM CHLORIDE 0.9 % IV SOLN: 250.0000 mL | INTRAVENOUS | Status: DC | PRN

## 2013-02-20 MED ORDER — MIDAZOLAM HCL 2 MG/2ML IJ SOLN
INTRAMUSCULAR | Status: AC
  Filled 2013-02-20: qty 2

## 2013-02-20 MED ORDER — FENTANYL CITRATE 0.05 MG/ML IJ SOLN
INTRAMUSCULAR | Status: AC
  Filled 2013-02-20: qty 2

## 2013-02-20 MED ORDER — SODIUM CHLORIDE 0.9 % IV SOLN: 1.0000 mL/kg/h | INTRAVENOUS | Status: DC

## 2013-02-20 MED ORDER — HEPARIN (PORCINE) IN NACL 2-0.9 UNIT/ML-% IJ SOLN
INTRAMUSCULAR | Status: AC
  Filled 2013-02-20: qty 1000

## 2013-02-20 MED ORDER — SODIUM CHLORIDE 0.9 % IJ SOLN: 3.0000 mL | INTRAMUSCULAR | Status: DC | PRN

## 2013-02-20 MED ORDER — SODIUM CHLORIDE 0.9 % IJ SOLN: 3.0000 mL | Freq: Two times a day (BID) | INTRAMUSCULAR | Status: DC

## 2013-02-20 MED ORDER — NITROGLYCERIN 0.2 MG/ML ON CALL CATH LAB
INTRAVENOUS | Status: AC
  Filled 2013-02-20: qty 1

## 2013-02-20 NOTE — Progress Notes (Signed)
ANTICOAGULATION CONSULT NOTE Pharmacy Consult for Heparin Indication: chest pain/ACS  No Known Allergies  Patient Measurements: Height: 5' 3.5" (161.3 cm) Weight: 157 lb 13.6 oz (71.6 kg) IBW/kg (Calculated) : 53.55 Heparin Dosing Weight: 71.7 kg  Vital Signs: Temp: 97.5 F (36.4 C) (07/14 0359) Temp src: Oral (07/14 0359) BP: 152/61 mmHg (07/14 0359) Pulse Rate: 75 (07/14 0359)  Labs:  Recent Labs  02/18/13 0718 02/18/13 0909 02/18/13 1608  02/19/13 0335 02/19/13 1016 02/19/13 1423 02/20/13 0548  HGB 14.6  --  13.0  --  13.4  --   --  13.2  HCT 42.7  --  37.6  --  39.7  --   --  39.2  PLT 199  --  170  --  175  --   --  165  APTT  --  25  --   --   --   --   --   --   LABPROT  --   --   --   --   --   --   --  12.7  INR  --   --   --   --   --   --   --  0.97  HEPARINUNFRC  --   --   --   < > 0.53  --  0.68 0.87*  CREATININE 0.97  --  0.91  --  0.90  --   --   --   TROPONINI <0.30  --   --   --   --  <0.30  --   --   < > = values in this interval not displayed.  Estimated Creatinine Clearance: 48.6 ml/min (by C-G formula based on Cr of 0.9).   Assessment: 79 yo female with chest pain for heparin  Goal of Therapy:  Heparin level 0.3-0.7 units/ml Monitor platelets by anticoagulation protocol: Yes   Plan:  Decrease heparin 900 units/hr F/U after cath  Eddie Candle   02/20/2013 6:34 AM

## 2013-02-20 NOTE — Progress Notes (Signed)
To the cath lab by bed. Stable. 

## 2013-02-20 NOTE — Interval H&P Note (Signed)
History and Physical Interval Note:  02/20/2013 9:48 AM  [name] [name]  has presented today for surgery, with the diagnosis of cp  The various methods of treatment have been discussed with the patient and family. After consideration of risks, benefits and other options for treatment, the patient has consented to  Procedure(s): LEFT HEART CATHETERIZATION WITH CORONARY ANGIOGRAM (N/A) as a surgical intervention .  The patient's history has been reviewed, patient examined, no change in status, stable for surgery.  I have reviewed the patient's chart and labs.  Questions were answered to the patient's satisfaction.    Cath Lab Visit (complete for each Cath Lab visit)  Clinical Evaluation Leading to the Procedure:   ACS: yes  Non-ACS:    Anginal Classification: CCS IV  Anti-ischemic medical therapy: Minimal Therapy (1 class of medications)  Non-Invasive Test Results: No non-invasive testing performed  Prior CABG: No previous CABG         Tonny Bollman

## 2013-02-20 NOTE — Discharge Summary (Signed)
Discharge Summary   Patient ID: [name] [name],  MRN: [id number], DOB/AGE: 1931-10-09 79 y.o.  Admit date: 02/18/2013 Discharge date: 02/20/2013  Primary Physician: Fredirick Maudlin, MD Primary Cardiologist: Judie Petit. Excell Seltzer, MD  Discharge Diagnoses Principal Problem:   Chest pain with high risk for cardiac etiology Active Problems:   CAD, NATIVE VESSEL   History of pulmonary embolus (PE)   HYPOTHYROIDISM   DM   HYPERLIPIDEMIA-MIXED   HYPERTENSION  Allergies No Known Allergies  Diagnostic Studies/Procedures  PORTABLE CHEST X-RAY - 02/18/13  IMPRESSION:  No active disease.  CARDIAC CATHETERIZATION - 02/20/13  Hemodynamics:  AO 124/56 with a mean of 84  LV 120/18   Coronary angiography:  Coronary dominance: right  Left mainstem: The left main is patent. There is no obstructive disease. The left main originates from the left coronary cusp. The vessel bifurcates into the LAD and left circumflex.  Left anterior descending (LAD): The LAD is a moderate caliber vessel. It reaches the left ventricular apex. The terminal portion of the LAD is very small in caliber. The first diagonal is moderate to large in caliber. There is 30-40% proximal stenosis. There is mild calcification of the proximal LAD at the origin of the second diagonal. There is an associated 30-40% stenosis. The first diagonal branch has very tight 90% stenosis, but this branch is small (1 mm or less).  Left circumflex (LCx): The left circumflex is patent. There is no obstructive disease. The vessel gives off 2 obtuse marginal branches without significant stenosis.  Right coronary artery (RCA): This is a large, dominant vessel. The stent in the mid vessel is widely patent. Just before the stent there is a smooth 20-30% stenosis. The PDA and PLA branches are patent without significant stenosis.  Left ventriculography: Left ventricular systolic function is normal, LVEF is estimated at 55-65%, there is no significant mitral  regurgitation   Final Conclusions:  1. Mild nonobstructive coronary artery disease with continued patency of the LAD, left circumflex, and RCA.  2. Widely patent right coronary artery stent  3. Severe stenosis of the small second diagonal branch  4. Normal/vigorous left ventricular systolic function  History of Present Illness [name] [name] is a 79 y.o. female who was admitted to [facility] on 02/20/13 with the above problem list.   She has a history of CAD (85% prox DES s/p RCA, 75% sotial D1, 30% prox LAD in 2009; Lexiscan Myoview 2011- EF 82%, negative for ischemia/infarct), uncontrolled DM2, HTN, HLD, h/o PE and hypothyroidism. She was in her USOH leading up to the date of admission. She is typically able to perform household chores w/o incident. The date of admission, she awoke at 6AM with midsternal/left-sided chest pain rated at a 10/10 with associated diaphoresis and shortness of breath. Denied nausea/vomiting. This was reminiscent of her prior anginal pain thus prompting her ED presentation.   There, EKG revealed NSR, PACs otherwise no acute ST/T changes. Initial trop-I returned WNL. CXR as above indicated no acute process. CMET- mild hypoalbuminemia 3.1, mild hyperglycemia 226. Hgb A1C 9.5%. TSH WNL. CBC unremarkable. VSS. Given the patient's high pretest probability, the decision was made to proceed with diagnostic cardiac catheterization.   Hospital Course   There was low overall suspicion for PE. She was heparinized and continued on her home cardiac medications. Two subsequent troponins returned WNL. She remained stable leading up to Monday. She was evaluated by Dr. Excell Seltzer who agreed to proceed with cardiac catheterization.   She was informed, consented and  prepped for the procedure which was accessed via the R radial artery by u/s guidance. As above, this revealed 30-40% prox D1, 90% D1 (small), 30-40% prox LAD calcification, patent RCA stent, 20-30% stenosis proximal to  stent; LVEF 55-65%. She tolerated the procedure well w/o complications. The recommendation was made to medically manage and discharge later today.   She will be discharged after ambulating in the halls. She will follow-up with her PCP to evaluate for alternative causes of chest pain, and for uncontrolled diabetes management. She will resume her outpatient medications as detailed below. This information, including post-cath instructions and activity restrictions, has been clearly outlined in the discharge AVS.   Discharge Vitals:  Blood pressure 132/56, pulse 69, temperature 97.7 F (36.5 C), temperature source Oral, resp. rate 11, height 5' 3.5" (1.613 m), weight 71.6 kg (157 lb 13.6 oz), SpO2 98.00%.   Weight change: -0.068 kg (-2.4 oz)  Labs: Recent Labs     02/19/13  0335  02/20/13  0548  WBC  9.4  8.9  HGB  13.4  13.2  HCT  39.7  39.2  MCV  88.8  89.1  PLT  175  165   Recent Labs Lab 02/18/13 0718 02/18/13 1608 02/19/13 0335  NA 139 138 138  K 3.7 3.5 3.8  CL 101 104 105  CO2 27 21 23   BUN 24* 19 21  CREATININE 0.97 0.91 0.90  CALCIUM 9.5 8.6 8.7  PROT  --  6.2  --   BILITOT  --  0.2*  --   ALKPHOS  --  98  --   ALT  --  15  --   AST  --  19  --   GLUCOSE 220* 231* 110*   Recent Labs     02/18/13  1608  HGBA1C  9.5*   Recent Labs     02/18/13  0718  02/19/13  1016  TROPONINI  <0.30  <0.30   Recent Labs     02/19/13  0335  CHOL  111  HDL  31*  LDLCALC  10  TRIG  352*  CHOLHDL  3.6    Recent Labs  02/18/13 1608  TSH 0.716    Disposition:  Discharge Orders   Future Appointments Provider Department Dept Phone   03/31/2013 12:15 PM Tonny Bollman, MD [facility] Main Office [place]) [phone]   04/18/2013 1:15 PM Tonny Bollman, MD [facility] Main Office [place]) [phone]   Future Orders Complete By Expires     Diet - low sodium heart healthy  As directed     Increase activity slowly  As directed       Follow-up Information     Follow up with HAWKINS,EDWARD L, MD. Schedule an appointment as soon as possible for a visit in 1 week. (For uncontrolled diabetes management. )    Contact information:   [address] [place] [address] [phone]       Follow up with Tonny Bollman, MD On 03/31/2013. (At 12:15 PM for post-hospital cardiology follow-up. )    Contact information:   [address] [place] [address] [phone]       Discharge Medications:    Medication List         amiloride-hydrochlorothiazide 5-50 MG tablet  Commonly known as:  MODURETIC  Take 1 tablet by mouth daily.     aspirin EC 81 MG tablet  Take 81 mg by mouth daily.     fenofibrate 145  MG tablet  Commonly known as:  TRICOR  Take 145 mg by mouth daily.     insulin aspart 100 UNIT/ML injection  Commonly known as:  novoLOG  Inject 15 Units into the skin 3 (three) times daily after meals.     insulin glargine 100 UNIT/ML injection  Commonly known as:  LANTUS  Inject 80 Units into the skin at bedtime.     levothyroxine 175 MCG tablet  Commonly known as:  SYNTHROID, LEVOTHROID  Take 175 mcg by mouth daily.     metoprolol 50 MG tablet  Commonly known as:  LOPRESSOR  Take 1 tablet (50 mg total) by mouth 2 (two) times daily.     nitroGLYCERIN 0.4 MG SL tablet  Commonly known as:  NITROSTAT  Place 1 tablet (0.4 mg total) under the tongue every 5 (five) minutes x 3 doses as needed.     rosuvastatin 40 MG tablet  Commonly known as:  CRESTOR  Take 80 mg by mouth at bedtime.       Outstanding Labs/Studies: None  Duration of Discharge Encounter: Greater than 30 minutes including physician time.  Signed, R. Hurman Horn, PA-C 02/20/2013, 1:17 PM

## 2013-02-20 NOTE — Care Management Note (Signed)
    Page 1 of 1   02/20/2013     9:39:24 AM   CARE MANAGEMENT NOTE 02/20/2013  Patient:  [name] [name], [name] [name]   Account Number:  [id number]  Date Initiated:  02/20/2013  Documentation initiated by:  Junius Creamer  Subjective/Objective Assessment:   adm w angina     Action/Plan:   lives w husband, pcp dr ed Juanetta Gosling   Anticipated DC Date:     Anticipated DC Plan:        DC Planning Services  CM consult      Choice offered to / List presented to:             Status of service:   Medicare Important Message given?   (If response is "NO", the following Medicare IM given date fields will be blank) Date Medicare IM given:   Date Additional Medicare IM given:    Discharge Disposition:    Per UR Regulation:  Reviewed for med. necessity/level of care/duration of stay  If discussed at Long Length of Stay Meetings, dates discussed:    Comments:

## 2013-02-20 NOTE — Progress Notes (Signed)
Back from the cath lab.by bed , awake and alert. Tr band to right wrist intact , + pulses, elevated with pillow. Denied  any discomfort, continue to monitor.

## 2013-02-20 NOTE — CV Procedure (Addendum)
   Cardiac Catheterization Procedure Note  Name: [name] [name] MRN: [id number] DOB: [birth year]  Procedure: Ultrasound guidance of the right radial artery, Left Heart Cath, Selective Coronary Angiography, LV angiography  Indication: Chest pain concerning for unstable angina. 79 year-old woman with known CAD and prior RCA stenting in 2009.   Procedural Details: The right wrist was prepped, draped, and anesthetized with 1% lidocaine. Using the modified Seldinger technique, a 5 French sheath was introduced into the right radial artery. The vessel was difficult to access. I used ultrasound for direct visualization and needle entry into the artery via a front wall puncture. 3 mg of verapamil was administered through the sheath, weight-based unfractionated heparin was administered intravenously. Standard Judkins catheters were used for selective coronary angiography and left ventriculography. Catheter exchanges were performed over an exchange length guidewire. There were no immediate procedural complications. A TR band was used for radial hemostasis at the completion of the procedure.  The patient was transferred to the post catheterization recovery area for further monitoring.  Procedural Findings: Hemodynamics: AO 124/56 with a mean of 84 LV 120/18  Coronary angiography: Coronary dominance: right  Left mainstem: The left main is patent. There is no obstructive disease. The left main originates from the left coronary cusp. The vessel bifurcates into the LAD and left circumflex.  Left anterior descending (LAD): The LAD is a moderate caliber vessel. It reaches the left ventricular apex. The terminal portion of the LAD is very small in caliber. The first diagonal is moderate to large in caliber. There is 30-40% proximal stenosis. There is mild calcification of the proximal LAD at the origin of the second diagonal. There is an associated 30-40% stenosis. The first diagonal branch has very tight 90%  stenosis, but this branch is small (1 mm or less).  Left circumflex (LCx): The left circumflex is patent. There is no obstructive disease. The vessel gives off 2 obtuse marginal branches without significant stenosis.  Right coronary artery (RCA): This is a large, dominant vessel. The stent in the mid vessel is widely patent. Just before the stent there is a smooth 20-30% stenosis. The PDA and PLA branches are patent without significant stenosis.  Left ventriculography: Left ventricular systolic function is normal, LVEF is estimated at 55-65%, there is no significant mitral regurgitation   Final Conclusions:   1. Mild nonobstructive coronary artery disease with continued patency of the LAD, left circumflex, and RCA. 2. Widely patent right coronary artery stent 3. Severe stenosis of the small second diagonal branch 4. Normal/vigorous left ventricular systolic function  Recommendations: Medical therapy. Patient is eligible for discharge later today.  Tonny Bollman 02/20/2013, 10:44 AM

## 2013-02-20 NOTE — Progress Notes (Signed)
TR band removed, no bleeding noted, 2x2 gauze and tegaderm applied. Continue to monitor.

## 2013-02-20 NOTE — H&P (View-Only) (Signed)
    Subjective:  No chest pain this am. No dyspnea.   Objective:  Vital Signs in the last 24 hours: Temp:  [97.5 F (36.4 C)-98.5 F (36.9 C)] 97.5 F (36.4 C) (07/14 0359) Pulse Rate:  [75-104] 75 (07/14 0359) Resp:  [15-17] 15 (07/14 0359) BP: (116-185)/(37-73) 152/61 mmHg (07/14 0359) SpO2:  [93 %-98 %] 95 % (07/14 0359) Weight:  [71.6 kg (157 lb 13.6 oz)] 71.6 kg (157 lb 13.6 oz) (07/14 0359)  Intake/Output from previous day: 07/13 0701 - 07/14 0700 In: 1292 [P.O.:800; I.V.:492] Out: 1100 [Urine:1100]  Physical Exam: Pt is alert and oriented, NAD HEENT: normal Neck: JVP - normal Lungs: CTA bilaterally CV: RRR without murmur or gallop Abd: soft, NT, Positive BS, no hepatomegaly Ext: no C/C/E, distal pulses intact and equal Skin: warm/dry no rash  Lab Results:  Recent Labs  02/19/13 0335 02/20/13 0548  WBC 9.4 8.9  HGB 13.4 13.2  PLT 175 165    Recent Labs  02/18/13 1608 02/19/13 0335  NA 138 138  K 3.5 3.8  CL 104 105  CO2 21 23  GLUCOSE 231* 110*  BUN 19 21  CREATININE 0.91 0.90    Recent Labs  02/18/13 0718 02/19/13 1016  TROPONINI <0.30 <0.30    Cardiac Studies: Cath pending  Tele: Sinus rhythm  Assessment/Plan:  1. Unstable angina - troponin negative x 3. Pt chest-pain free on IV heparin. Plan cath and possible PCI today. Risks, indication, alternatives reviewed with patient - she understands and agrees to proceed.  2. Hyperlipidemia - lipids reviewed, reflective of uncontrolled diabetes. On tricor and atorvastatin with LDL at goal.  3. Diabetes, uncontrolled. Will need outpatient med adjustment.  Dispo: pending cath today.  Tonny Bollman, [name] 02/20/2013, 7:50 AM

## 2013-02-20 NOTE — Progress Notes (Signed)
    Subjective:  No chest pain this am. No dyspnea.   Objective:  Vital Signs in the last 24 hours: Temp:  [97.5 F (36.4 C)-98.5 F (36.9 C)] 97.5 F (36.4 C) (07/14 0359) Pulse Rate:  [75-104] 75 (07/14 0359) Resp:  [15-17] 15 (07/14 0359) BP: (116-185)/(37-73) 152/61 mmHg (07/14 0359) SpO2:  [93 %-98 %] 95 % (07/14 0359) Weight:  [71.6 kg (157 lb 13.6 oz)] 71.6 kg (157 lb 13.6 oz) (07/14 0359)  Intake/Output from previous day: 07/13 0701 - 07/14 0700 In: 1292 [P.O.:800; I.V.:492] Out: 1100 [Urine:1100]  Physical Exam: Pt is alert and oriented, NAD HEENT: normal Neck: JVP - normal Lungs: CTA bilaterally CV: RRR without murmur or gallop Abd: soft, NT, Positive BS, no hepatomegaly Ext: no C/C/E, distal pulses intact and equal Skin: warm/dry no rash  Lab Results:  Recent Labs  02/19/13 0335 02/20/13 0548  WBC 9.4 8.9  HGB 13.4 13.2  PLT 175 165    Recent Labs  02/18/13 1608 02/19/13 0335  NA 138 138  K 3.5 3.8  CL 104 105  CO2 21 23  GLUCOSE 231* 110*  BUN 19 21  CREATININE 0.91 0.90    Recent Labs  02/18/13 0718 02/19/13 1016  TROPONINI <0.30 <0.30    Cardiac Studies: Cath pending  Tele: Sinus rhythm  Assessment/Plan:  1. Unstable angina - troponin negative x 3. Pt chest-pain free on IV heparin. Plan cath and possible PCI today. Risks, indication, alternatives reviewed with patient - she understands and agrees to proceed.  2. Hyperlipidemia - lipids reviewed, reflective of uncontrolled diabetes. On tricor and atorvastatin with LDL at goal.  3. Diabetes, uncontrolled. Will need outpatient med adjustment.  Dispo: pending cath today.  Raydel Hosick, [name] 02/20/2013, 7:50 AM     

## 2013-03-31 ENCOUNTER — Encounter: Payer: Self-pay | Admitting: Cardiovascular Disease

## 2013-03-31 ENCOUNTER — Ambulatory Visit (INDEPENDENT_AMBULATORY_CARE_PROVIDER_SITE_OTHER): Payer: Medicare Other | Admitting: Cardiovascular Disease

## 2013-03-31 VITALS — BP 144/70 | HR 90 | Ht 63.5 in | Wt 165.0 lb

## 2013-03-31 DIAGNOSIS — I2510 Atherosclerotic heart disease of native coronary artery without angina pectoris: Secondary | ICD-10-CM

## 2013-03-31 DIAGNOSIS — R079 Chest pain, unspecified: Secondary | ICD-10-CM

## 2013-03-31 NOTE — Progress Notes (Signed)
HPI:   79 year old woman presenting for followup evaluation. She was recently hospitalized with chest pain. The patient has coronary artery disease with previous stenting.she underwent cardiac catheterization as her symptoms were concerning for unstable angina. This demonstrated patency of her coronary stent and no significant obstructive disease in her major coronary vessels. There were some branch vessel disease noted.  Since hospital discharge he has done well. She had one episode of diaphoresis and chest discomfort when she was out on a riding lawnmower in the hot weather. She took one nitroglycerin and symptoms resolved. She's had no other symptoms. She denies palpitations, edema, lightheadedness, or syncope.  Outpatient Encounter Prescriptions as of 03/31/2013  Medication Sig Dispense Refill  . amiloride-hydrochlorothiazide (MODURETIC) 5-50 MG tablet Take 1 tablet by mouth daily.        Marland Kitchen aspirin EC 81 MG tablet Take 81 mg by mouth daily.      . fenofibrate (TRICOR) 145 MG tablet Take 145 mg by mouth daily.       . insulin aspart (NOVOLOG) 100 UNIT/ML injection Inject 15 Units into the skin 3 (three) times daily after meals.       . insulin glargine (LANTUS) 100 UNIT/ML injection Inject 80 Units into the skin at bedtime.       Marland Kitchen levothyroxine (SYNTHROID, LEVOTHROID) 175 MCG tablet Take 175 mcg by mouth daily.       . metoprolol (LOPRESSOR) 50 MG tablet Take 1 tablet (50 mg total) by mouth 2 (two) times daily.  60 tablet  12  . nitroGLYCERIN (NITROSTAT) 0.4 MG SL tablet Place 1 tablet (0.4 mg total) under the tongue every 5 (five) minutes x 3 doses as needed.  25 tablet  3  . rosuvastatin (CRESTOR) 40 MG tablet Take 80 mg by mouth at bedtime.        No facility-administered encounter medications on file as of 03/31/2013.    No Known Allergies  Past Medical History  Diagnosis Date  . Diabetes mellitus   . Thyroid disease   . Hypertension   . CAD (coronary artery disease)     DES tor  RCA 2009.  75% ostial first diagonal stenosis, 30% proximal LAD stenosis, 85% proximal right coronary artery stenosis treated with Promus 3.5 x 18 stent.  . Palpitations   . Hyperlipidemia   . PE (pulmonary embolism)    ROS: Negative except as per HPI  BP 144/70  Pulse 90  Ht 5' 3.5" (1.613 m)  Wt 165 lb (74.844 kg)  BMI 28.77 kg/m2  SpO2 97%  PHYSICAL EXAM: Pt is alert and oriented, NAD HEENT: normal Neck: JVP - normal, carotids 2+= without bruits Lungs: CTA bilaterally CV: RRR without murmur or gallop Abd: soft, NT, Positive BS, no hepatomegaly Ext: no C/C/E, distal pulses intact and equal Skin: warm/dry no rash  EKG:  Normal sinus rhythm 90 beats per minute, within normal limits.  Cardiac catheterization 02/18/2013: Procedural Findings:  Hemodynamics:  AO 124/56 with a mean of 84  LV 120/18  Coronary angiography:  Coronary dominance: right  Left mainstem: The left main is patent. There is no obstructive disease. The left main originates from the left coronary cusp. The vessel bifurcates into the LAD and left circumflex.  Left anterior descending (LAD): The LAD is a moderate caliber vessel. It reaches the left ventricular apex. The terminal portion of the LAD is very small in caliber. The first diagonal is moderate to large in caliber. There is 30-40% proximal stenosis. There is mild  calcification of the proximal LAD at the origin of the second diagonal. There is an associated 30-40% stenosis. The first diagonal branch has very tight 90% stenosis, but this branch is small (1 mm or less).  Left circumflex (LCx): The left circumflex is patent. There is no obstructive disease. The vessel gives off 2 obtuse marginal branches without significant stenosis.  Right coronary artery (RCA): This is a large, dominant vessel. The stent in the mid vessel is widely patent. Just before the stent there is a smooth 20-30% stenosis. The PDA and PLA branches are patent without significant stenosis.   Left ventriculography: Left ventricular systolic function is normal, LVEF is estimated at 55-65%, there is no significant mitral regurgitation  Final Conclusions:  1. Mild nonobstructive coronary artery disease with continued patency of the LAD, left circumflex, and RCA.  2. Widely patent right coronary artery stent  3. Severe stenosis of the small second diagonal branch  4. Normal/vigorous left ventricular systolic function  Recommendations: Medical therapy. Patient is eligible for discharge later today.   CXR 02/18/2013: Findings: The heart size and mediastinal contours are within normal limits. Both lungs are clear. Stable mild thoracic scoliosis.  IMPRESSION: No active disease.  ASSESSMENT AND PLAN: 1. Coronary atherosclerosis, native vessel. The patient's heart catheterization results were reviewed. She had patency of all of her major coronary arteries. There were some small vessel disease noted. She should continue with aggressive risk reduction measures. I will see her backin 6 months.  2. Hypertension. Blood pressure is controlled on a combination of metoprolol and hydrochlorothiazide.  3. Hyperlipidemia.lipids from the hospital showed a cholesterol of 111, HDL 31, and LDL of 10. I suspect this was a runny is. Her triglycerides were 352. VLDL was 70. It is listed that she takes Crestor 80 mg at bedtime which exceeds the maximum dose. She tells me that she is taking 20 mg of Crestor twice daily. She sees Dr. Juanetta Gosling next Tuesday and he treats her lipids. She's on a combination of Crestor and fenofibrate. We'll ask him to review her meds.  For followup of see her back in 6 months.  Tonny Bollman 03/31/2013 12:27 PM

## 2013-03-31 NOTE — Patient Instructions (Addendum)
Your physician wants you to follow-up in: 6 MONTHS with Dr Excell Seltzer. You will receive a reminder letter in the mail two months in advance. If you don't receive a letter, please call our office to schedule the follow-up appointment.  Please discuss your Crestor dosage with Dr Juanetta Gosling next week.

## 2013-04-11 ENCOUNTER — Telehealth (HOSPITAL_COMMUNITY): Payer: Self-pay | Admitting: Dietician

## 2013-04-11 NOTE — Telephone Encounter (Signed)
Received notification from [employer] that pt wants to registered for DM class. Called at 1210. Pt registered for Thursday, 05/04/13 class at 1730.

## 2013-04-18 ENCOUNTER — Ambulatory Visit: Payer: Medicare Other | Admitting: Cardiovascular Disease

## 2013-04-24 ENCOUNTER — Encounter: Payer: Self-pay | Admitting: Endocrinology

## 2013-04-24 ENCOUNTER — Ambulatory Visit (INDEPENDENT_AMBULATORY_CARE_PROVIDER_SITE_OTHER): Payer: Medicare Other | Admitting: Endocrinology

## 2013-04-24 VITALS — BP 130/70 | HR 75 | Ht 63.0 in | Wt 162.0 lb

## 2013-04-24 DIAGNOSIS — E119 Type 2 diabetes mellitus without complications: Secondary | ICD-10-CM

## 2013-04-24 MED ORDER — INSULIN LISPRO 100 UNIT/ML ~~LOC~~ SOLN: SUBCUTANEOUS | Status: DC

## 2013-04-24 NOTE — Patient Instructions (Addendum)
good diet and exercise habits significanly improve the control of your diabetes.  please let me know if you wish to be referred to a dietician.  high blood sugar is very risky to your health.  you should see an eye doctor every year.  You are at higher than average risk for pneumonia and hepatitis-B.  You should be vaccinated against both.    controlling your blood pressure and cholesterol drastically reduces the damage diabetes does to your body.  this also applies to quitting smoking.  please discuss these with your doctor.   check your blood sugar twice a day.  vary the time of day when you check, between before the 3 meals, and at bedtime.  also check if you have symptoms of your blood sugar being too high or too low.  please keep a record of the readings and bring it to your next appointment here.  please call us sooner if your blood sugar goes below 70, or if you have a lot of readings over 200.   we will need to take this complex situation in stages.   For now, please reduce the lantus to 70 units at bedtime, and:   Increase the novolog to 3 times a day (just before each meal) 30-15-30 units.   Please come back for a follow-up appointment in 2 weeks.

## 2013-04-24 NOTE — Progress Notes (Signed)
Subjective:    Patient ID: [name] [name], female    DOB: Dec 19, 1931, 79 y.o.   MRN: [id number]  HPI pt states DM was dx'ed in 2008; she has mild if any neuropathy of the lower extremities, but she has associated CAD.  he has been on insulin since soon after dx of DM.  pt says his diet and exercise are good.   Pt says her cbg's are sometimes low in the afternoon or in the middle of the night.  She eats a bedtime snack, to avoid hypoglycemia in the middle of the night.   Past Medical History  Diagnosis Date  . Diabetes mellitus   . Thyroid disease   . Hypertension   . CAD (coronary artery disease)     DES tor RCA 2009.  75% ostial first diagonal stenosis, 30% proximal LAD stenosis, 85% proximal right coronary artery stenosis treated with Promus 3.5 x 18 stent.  . Palpitations   . Hyperlipidemia   . PE (pulmonary embolism)     Past Surgical History  Procedure Laterality Date  . Abdominal hysterectomy    . Cholecystectomy    . Tonsillectomy    . Appendectomy    . Abdominal cavity operation      Obstruction    History   Social History  . Marital Status: Married    Spouse Name: N/A    Number of Children: 0  . Years of Education: N/A   Occupational History  .     Social History Main Topics  . Smoking status: Never Smoker   . Smokeless tobacco: Not on file  . Alcohol Use: No  . Drug Use: No  . Sexual Activity: No   Other Topics Concern  . Not on file   Social History Narrative   Lives with husband.      Current Outpatient Prescriptions on File Prior to Visit  Medication Sig Dispense Refill  . amiloride-hydrochlorothiazide (MODURETIC) 5-50 MG tablet Take 1 tablet by mouth daily.        Marland Kitchen aspirin EC 81 MG tablet Take 81 mg by mouth daily.      . fenofibrate (TRICOR) 145 MG tablet Take 145 mg by mouth daily.       . insulin aspart (NOVOLOG) 100 UNIT/ML injection Inject 15 Units into the skin 3 (three) times daily after meals.       . insulin glargine (LANTUS) 100  UNIT/ML injection Inject 80 Units into the skin at bedtime.       Marland Kitchen levothyroxine (SYNTHROID, LEVOTHROID) 175 MCG tablet Take 175 mcg by mouth daily.       . metoprolol (LOPRESSOR) 50 MG tablet Take 1 tablet (50 mg total) by mouth 2 (two) times daily.  60 tablet  12  . nitroGLYCERIN (NITROSTAT) 0.4 MG SL tablet Place 1 tablet (0.4 mg total) under the tongue every 5 (five) minutes x 3 doses as needed.  25 tablet  3  . rosuvastatin (CRESTOR) 40 MG tablet Take 80 mg by mouth at bedtime.        No current facility-administered medications on file prior to visit.   No Known Allergies  Family History  Problem Relation Age of Onset  . Coronary artery disease    . Hypertension    DM: none  BP 130/70  Pulse 75  Ht 5\' 3"  (1.6 m)  Wt 162 lb (73.483 kg)  BMI 28.7 kg/m2  SpO2 98%  Review of Systems denies weight loss, blurry vision, headache, chest pain, sob,  n/v, urinary frequency, cramps, excessive diaphoresis, memory loss, depression, menopausal sxs, rhinorrhea, numbness, and easy bruising.      Objective:   Physical Exam VS: see vs page GEN: no distress HEAD: head: no deformity eyes: no periorbital swelling, no proptosis external nose and ears are normal mouth: no lesion seen NECK: supple, thyroid is not enlarged CHEST WALL: no deformity LUNGS:  Clear to auscultation CV: reg rate and rhythm, no murmur ABD: abdomen is soft, nontender.  no hepatosplenomegaly.  not distended.  no hernia MUSCULOSKELETAL: muscle bulk and strength are grossly normal.  no obvious joint swelling.  gait is normal and steady PULSES: no carotid bruit NEURO:  cn 2-12 grossly intact.   readily moves all 4's.   SKIN:  Normal texture and temperature.  No rash or suspicious lesion is visible.   NODES:  None palpable at the neck PSYCH: alert, oriented x3.  Does not appear anxious nor depressed.   outside test results are reviewed: A1c=9.5%    Assessment & Plan:  DM: This insulin regimen was chosen from  multiple options, as it best matches her insulin to her changing requirements throughout the day.  The benefits of glycemic control must be weighed against the risks of hypoglycemia.  She needs increased rx CAD: in this setting, she should avoid hypoglycemia. HTN: well-controlled.  This control mitigates the development of chronic complications of DM.

## 2013-05-04 ENCOUNTER — Encounter (HOSPITAL_COMMUNITY): Payer: Self-pay | Admitting: Dietician

## 2013-05-04 NOTE — Progress Notes (Signed)
[facility] Diabetes Class Completion  Date:May 04, 2013  Time: 1730  Pt attended [facility]'s Diabetes Group Education Class on May 04, 2013.   Patient was educated on the following topics:   -Survival skills (signs and symptoms of hyperglycemia and hypoglycemia, treatment for hypoglycemia, ideal levels for fasting and postprandial blood sugars, goal Hgb A1c level, foot care basics)  -Recommendations for physical activity   -Carbohydrate metabolism in relation to diabetes   -Meal planning (sources of carbohydrate, carbohydrate counting, meal planning strategies, food label reading, and portion control).  Handouts provided:  -"Diabetes and You: Taking Charge of Your Health"  -"Carbohydrate Counting and Meal Planning"  -"Your Guide to Better Office Visits"   Cairo Lingenfelter A. Mayford Knife, RD, LDN

## 2013-05-05 ENCOUNTER — Ambulatory Visit: Payer: Medicare Other | Admitting: Cardiovascular Disease

## 2013-08-09 ENCOUNTER — Other Ambulatory Visit: Payer: Self-pay

## 2013-08-09 MED ORDER — METOPROLOL TARTRATE 50 MG PO TABS: 50.0000 mg | ORAL_TABLET | Freq: Two times a day (BID) | ORAL | Status: DC

## 2013-09-13 ENCOUNTER — Ambulatory Visit (INDEPENDENT_AMBULATORY_CARE_PROVIDER_SITE_OTHER): Payer: Medicare Other | Admitting: Cardiovascular Disease

## 2013-09-13 ENCOUNTER — Encounter: Payer: Self-pay | Admitting: Cardiovascular Disease

## 2013-09-13 VITALS — BP 138/80 | HR 72 | Ht 63.0 in | Wt 156.4 lb

## 2013-09-13 DIAGNOSIS — R55 Syncope and collapse: Secondary | ICD-10-CM

## 2013-09-13 LAB — CBC WITH DIFFERENTIAL/PLATELET
BASOS ABS: 0 10*3/uL (ref 0.0–0.1)
Basophils Relative: 0.5 % (ref 0.0–3.0)
EOS ABS: 0.2 10*3/uL (ref 0.0–0.7)
Eosinophils Relative: 2 % (ref 0.0–5.0)
HCT: 41 % (ref 36.0–46.0)
HEMOGLOBIN: 13.5 g/dL (ref 12.0–15.0)
LYMPHS PCT: 28.3 % (ref 12.0–46.0)
Lymphs Abs: 2.2 10*3/uL (ref 0.7–4.0)
MCHC: 33 g/dL (ref 30.0–36.0)
MCV: 93.3 fl (ref 78.0–100.0)
MONOS PCT: 8.8 % (ref 3.0–12.0)
Monocytes Absolute: 0.7 10*3/uL (ref 0.1–1.0)
NEUTROS ABS: 4.7 10*3/uL (ref 1.4–7.7)
Neutrophils Relative %: 60.4 % (ref 43.0–77.0)
PLATELETS: 212 10*3/uL (ref 150.0–400.0)
RBC: 4.39 Mil/uL (ref 3.87–5.11)
RDW: 13 % (ref 11.5–14.6)
WBC: 7.7 10*3/uL (ref 4.5–10.5)

## 2013-09-13 LAB — BASIC METABOLIC PANEL WITH GFR
BUN: 20 mg/dL (ref 6–23)
CO2: 27 meq/L (ref 19–32)
Calcium: 8.8 mg/dL (ref 8.4–10.5)
Chloride: 104 meq/L (ref 96–112)
Creatinine, Ser: 1.2 mg/dL (ref 0.4–1.2)
GFR: 48.3 mL/min — ABNORMAL LOW
Glucose, Bld: 187 mg/dL — ABNORMAL HIGH (ref 70–99)
Potassium: 3.5 meq/L (ref 3.5–5.1)
Sodium: 140 meq/L (ref 135–145)

## 2013-09-13 NOTE — Patient Instructions (Signed)
Your physician has recommended that you wear an event monitor. Event monitors are medical devices that record the heart's electrical activity. Doctors most often us these monitors to diagnose arrhythmias. Arrhythmias are problems with the speed or rhythm of the heartbeat. The monitor is a small, portable device. You can wear one while you do your normal daily activities. This is usually used to diagnose what is causing palpitations/syncope (passing out).  Your physician recommends that you have lab work today:  Complete Blood Count and Basic Metabolic Panel  Your physician wants you to follow-up in: 6 months with Dr. Excell Seltzerooper.  You will receive a reminder letter in the mail two months in advance. If you don't receive a letter, please call our office to schedule the follow-up appointment.  Your physician recommends that you continue on your current medications as directed. Please refer to the Current Medication list given to you today.

## 2013-09-13 NOTE — Progress Notes (Signed)
    HPI:  80 year old woman presenting for followup of coronary artery disease. She underwent cardiac catheterization last year when she presented with chest pain symptoms concerning for unstable angina. This demonstrated patent coronary arteries with no significant restenosis of her previously placed stent and nonobstructive disease elsewhere. The patient has type 2 diabetes and is treated with insulin.  The patient denies chest pain or shortness of breath. She has occasional palpitations. She's had about 5 episodes of near-syncope over the past few months. The most recent occurred this morning when she was standing up cooking her breakfast. She became very lightheaded and was close to losing consciousness. She had to sit down and symptoms have slowly resolved. She feels a bit fatigued. She otherwise has been doing well without complaints. She stays well-hydrated.  Outpatient Encounter Prescriptions as of 09/13/2013  Medication Sig  . amiloride-hydrochlorothiazide (MODURETIC) 5-50 MG tablet Take 1 tablet by mouth daily.    Marland Kitchen. aspirin EC 81 MG tablet Take 81 mg by mouth daily.  . fenofibrate (TRICOR) 145 MG tablet Take 145 mg by mouth daily.   . insulin glargine (LANTUS) 100 UNIT/ML injection Inject 70 Units into the skin at bedtime.   . insulin lispro (HUMALOG) 100 UNIT/ML injection 3 times a day (just before each meal) 30-15-30 units  . levothyroxine (SYNTHROID, LEVOTHROID) 175 MCG tablet Take 175 mcg by mouth daily.   . metoprolol (LOPRESSOR) 50 MG tablet Take 1 tablet (50 mg total) by mouth 2 (two) times daily.  . nitroGLYCERIN (NITROSTAT) 0.4 MG SL tablet Place 1 tablet (0.4 mg total) under the tongue every 5 (five) minutes x 3 doses as needed.  . rosuvastatin (CRESTOR) 40 MG tablet Take 80 mg by mouth at bedtime.     No Known Allergies  Past Medical History  Diagnosis Date  . Diabetes mellitus   . Thyroid disease   . Hypertension   . CAD (coronary artery disease)     DES tor RCA 2009.   75% ostial first diagonal stenosis, 30% proximal LAD stenosis, 85% proximal right coronary artery stenosis treated with Promus 3.5 x 18 stent.  . Palpitations   . Hyperlipidemia   . PE (pulmonary embolism)     ROS: Negative except as per HPI  BP 138/80  Pulse 72  Ht 5\' 3"  (1.6 m)  Wt 156 lb 6.4 oz (70.943 kg)  BMI 27.71 kg/m2  PHYSICAL EXAM: Pt is alert and oriented, NAD HEENT: normal Neck: JVP - normal, carotids 2+= without bruits Lungs: CTA bilaterally CV: RRR without murmur or gallop Abd: soft, NT, Positive BS, no hepatomegaly Ext: no C/C/E, distal pulses intact and equal Skin: warm/dry no rash  EKG:  Normal sinus rhythm 72 beats per minute, within normal limits.  ASSESSMENT AND PLAN: 1. Coronary artery disease, native vessel. The patient is stable without symptoms of angina. Her medical program was reviewed and will be continued. She's on aspirin, beta blocker, and a statin drug.  2. Hypertension. Blood pressure appears well-controlled on a combination of metoprolol and amiloride/hydrochlorothiazide.  3. Pre-syncope. Unclear etiology. Could be hemodynamic related, dehydration, or cardiac arrhythmia. Recommended a CBC and metabolic panel as well as an event recorder to evaluate for arrhythmia.  4. Hyperlipidemia. Followed by Dr. Juanetta GoslingHawkins. The patient is on statin drug and TriCor.  5. Type 2 diabetes, insulin requiring. Followed by Dr. Everardo AllEllison.  Tonny BollmanMichael Cheri Ayotte 09/13/2013 2:48 PM

## 2013-09-18 ENCOUNTER — Ambulatory Visit: Payer: Medicare Other | Admitting: Cardiovascular Disease

## 2014-04-04 ENCOUNTER — Encounter: Payer: Self-pay | Admitting: Cardiovascular Disease

## 2014-04-04 ENCOUNTER — Ambulatory Visit (INDEPENDENT_AMBULATORY_CARE_PROVIDER_SITE_OTHER): Payer: Medicare Other | Admitting: Cardiovascular Disease

## 2014-04-04 VITALS — BP 120/80 | HR 73 | Ht 63.0 in | Wt 159.8 lb

## 2014-04-04 DIAGNOSIS — E785 Hyperlipidemia, unspecified: Secondary | ICD-10-CM

## 2014-04-04 DIAGNOSIS — I10 Essential (primary) hypertension: Secondary | ICD-10-CM

## 2014-04-04 DIAGNOSIS — I2510 Atherosclerotic heart disease of native coronary artery without angina pectoris: Secondary | ICD-10-CM
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
## 2014-04-04 NOTE — Patient Instructions (Signed)
Your physician wants you to follow-up in: 6 MONTHS with Dr Cooper.  You will receive a reminder letter in the mail two months in advance. If you don't receive a letter, please call our office to schedule the follow-up appointment.  Your physician recommends that you continue on your current medications as directed. Please refer to the Current Medication list given to you today.  

## 2014-07-19 ENCOUNTER — Encounter (HOSPITAL_COMMUNITY): Payer: Self-pay | Admitting: Cardiovascular Disease

## 2014-09-17 ENCOUNTER — Encounter: Payer: Self-pay | Admitting: Cardiovascular Disease

## 2014-09-17 NOTE — Telephone Encounter (Signed)
This encounter was created in error - please disregard.

## 2014-09-17 NOTE — Telephone Encounter (Signed)
New Msg       Pt states she is returning a phn call from today.   Please return call.

## 2014-09-25 ENCOUNTER — Ambulatory Visit: Payer: Self-pay | Admitting: Cardiovascular Disease

## 2014-10-11 ENCOUNTER — Other Ambulatory Visit: Payer: Self-pay | Admitting: Cardiovascular Disease

## 2014-10-12 ENCOUNTER — Ambulatory Visit: Payer: Medicare Other | Admitting: Cardiovascular Disease

## 2014-10-25 ENCOUNTER — Ambulatory Visit (INDEPENDENT_AMBULATORY_CARE_PROVIDER_SITE_OTHER): Payer: Medicare Other | Admitting: Cardiovascular Disease

## 2014-10-25 ENCOUNTER — Encounter: Payer: Self-pay | Admitting: Cardiovascular Disease

## 2014-10-25 VITALS — BP 134/80 | HR 70 | Ht 63.5 in | Wt 162.8 lb

## 2014-10-25 DIAGNOSIS — R0602 Shortness of breath: Secondary | ICD-10-CM

## 2014-10-25 DIAGNOSIS — I2510 Atherosclerotic heart disease of native coronary artery without angina pectoris: Secondary | ICD-10-CM

## 2014-10-25 DIAGNOSIS — I10 Essential (primary) hypertension: Secondary | ICD-10-CM

## 2014-10-25 NOTE — Patient Instructions (Signed)
Your physician has requested that you have an echocardiogram. Echocardiography is a painless test that uses sound waves to create images of your heart. It provides your doctor with information about the size and shape of your heart and how well your heart's chambers and valves are working. This procedure takes approximately one hour. There are no restrictions for this procedure.  Your physician wants you to follow-up in: 6 MONTHS with Dr Cooper.  You will receive a reminder letter in the mail two months in advance. If you don't receive a letter, please call our office to schedule the follow-up appointment.  Your physician recommends that you continue on your current medications as directed. Please refer to the Current Medication list given to you today.  

## 2014-10-25 NOTE — Progress Notes (Signed)
Cardiology Office Note   Date:  10/25/2014   ID:  [name] [name], DOB Sep 15, 1931, MRN [id number]  PCP:  Fredirick Maudlin, MD  Cardiologist:  Tonny Bollman, MD    No chief complaint on file.    History of Present Illness: [name] [name] is a 81 y.o. female who presents for followup of coronary artery disease. She underwent cardiac catheterization in 2014 when she presented with chest pain symptoms concerning for unstable angina. This demonstrated patent coronary arteries with no significant restenosis of her previously placed stent and nonobstructive disease elsewhere. The patient has type 2 diabetes and is treated with insulin.  Overall the patient is doing fairly well. She's not had any chest pain or pressure. She does admit to mild leg swelling. She also complains of exertional dyspnea, worse than in the past. She experiences this when she hurries or does any physical exertion. She also has shortness of breath associated with anxiety. She denies orthopnea or PND. She's had no fevers, chills, or cough.  Past Medical History  Diagnosis Date  . Diabetes mellitus   . Thyroid disease   . Hypertension   . CAD (coronary artery disease)     DES tor RCA 2009.  75% ostial first diagonal stenosis, 30% proximal LAD stenosis, 85% proximal right coronary artery stenosis treated with Promus 3.5 x 18 stent.  . Palpitations   . Hyperlipidemia   . PE (pulmonary embolism)     Past Surgical History  Procedure Laterality Date  . Abdominal hysterectomy    . Cholecystectomy    . Tonsillectomy    . Appendectomy    . Abdominal cavity operation      Obstruction  . Left heart catheterization with coronary angiogram N/A 02/20/2013    Procedure: LEFT HEART CATHETERIZATION WITH CORONARY ANGIOGRAM;  Surgeon: Tonny Bollman, MD;  Location[facility] C[facility] LAB;  Service: Cardiovascular;  Laterality: N/A;    Current Outpatient Prescriptions  Medication Sig Dispense Refill  . amiloride-hydrochlorothiazide  (MODURETIC) 5-50 MG tablet Take 1 tablet by mouth daily.      Marland Kitchen aspirin EC 81 MG tablet Take 81 mg by mouth daily.    . fenofibrate (TRICOR) 145 MG tablet Take 145 mg by mouth daily.     Marland Kitchen gabapentin (NEURONTIN) 300 MG capsule Take 300 mg by mouth daily.    . insulin glargine (LANTUS) 100 UNIT/ML injection Inject 70 Units into the skin at bedtime.     . insulin lispro (HUMALOG) 100 UNIT/ML injection 3 times a day (just before each meal) 30-15-30 units (Patient taking differently: Inject 30-15-30 units into the skin 3 times a day (just before each meal)) 30 mL 12  . levothyroxine (SYNTHROID, LEVOTHROID) 175 MCG tablet Take 175 mcg by mouth daily.     . metoprolol (LOPRESSOR) 50 MG tablet TAKE ONE TABLET BY MOUTH TWICE A DAY 60 tablet 0  . nitroGLYCERIN (NITROSTAT) 0.4 MG SL tablet Place 1 tablet (0.4 mg total) under the tongue every 5 (five) minutes x 3 doses as needed. (Patient taking differently: Place 0.4 mg under the tongue every 5 (five) minutes x 3 doses as needed (MAX 3 TABLETS). ) 25 tablet 3  . rosuvastatin (CRESTOR) 40 MG tablet Take 40 mg by mouth at bedtime.      No current facility-administered medications for this visit.    Allergies:   Review of patient's allergies indicates no known allergies.   Social History:  The patient  reports that she has never smoked. She does not  have any smokeless tobacco history on file. She reports that she does not drink alcohol or use illicit drugs.   Family History:  The patient's family history includes Aneurysm in her brother; Cancer in her father and mother; Coronary artery disease in an other family member; Hypertension in an other family member.    ROS:  Please see the history of present illness.  Otherwise, review of systems is positive for exertional dyspnea.  All other systems are reviewed and negative.   PHYSICAL EXAM: VS:  BP 134/80 mmHg  Pulse 70  Ht 5' 3.5" (1.613 m)  Wt 162 lb 12.8 oz (73.846 kg)  BMI 28.38 kg/m2 , BMI Body mass  index is 28.38 kg/(m^2). GEN: Well nourished, well developed, pleasant elderly womanin no acute distress HEENT: normal Neck: no JVD, no masses. No carotid bruits Cardiac: RRR without murmur or gallop                Respiratory:  clear to auscultation bilaterally, normal work of breathing GI: soft, nontender, nondistended, + BS MS: no deformity or atrophy Ext: no pretibial edema, pedal pulses 2+= bilaterally Skin: warm and dry, no rash Neuro:  Strength and sensation are intact Psych: euthymic mood, full affect  EKG:  EKG is ordered today. The ekg ordered today shows NSR 70 bpm, age-indeterminate septal infarct, otherwise within normal limits  Recent Labs: No results found for requested labs within last 365 days.   Lipid Panel     Component Value Date/Time   CHOL 111 02/19/2013 0335   TRIG 352* 02/19/2013 0335   HDL 31* 02/19/2013 0335   CHOLHDL 3.6 02/19/2013 0335   VLDL 70* 02/19/2013 0335   LDLCALC 10 02/19/2013 0335      Wt Readings from Last 3 Encounters:  10/25/14 162 lb 12.8 oz (73.846 kg)  04/04/14 159 lb 12.8 oz (72.485 kg)  09/13/13 156 lb 6.4 oz (70.943 kg)     Cardiac Studies Reviewed: Cardiac catheterization July 2014: Final Conclusions:  1. Mild nonobstructive coronary artery disease with continued patency of the LAD, left circumflex, and RCA. 2. Widely patent right coronary artery stent 3. Severe stenosis of the small second diagonal branch 4. Normal/vigorous left ventricular systolic function  ASSESSMENT AND PLAN: 1.  CAD, native vessel: occasional chest tightness with emotional stress, this is longstanding. Overall doing well without exertional angina.   2. HTN: BP controlled.  3. Hyperlipidemia: treated with Tricor and crestor. Lipids/LFT's followed by Dr Juanetta GoslingHawkins.  4. Type 2 DM: followed by Dr Juanetta GoslingHawkins. States glycemic control is good unless she slips up on diet.   5. Exertional dyspnea: Unclear etiology, deconditioning may be a significant part  of this. As this has not been a prominent complaint in the past, I recommended a 2-D echocardiogram in the setting of her diabetes, coronary artery disease, and hypertension.  Current medicines are reviewed with the patient today.  The patient does not have concerns regarding medicines.  The following changes have been made:  no change  Labs/ tests ordered today include:  No orders of the defined types were placed in this encounter.    Disposition:   FU 6 months  Signed, Tonny BollmanMichael Bard Haupert, MD  10/25/2014 12:12 PM    [facility] [address], [place], [place]  [address] Phone: [phone]; Fax: [phone]

## 2014-11-05 ENCOUNTER — Other Ambulatory Visit (HOSPITAL_COMMUNITY): Payer: Medicare Other

## 2014-12-20 ENCOUNTER — Other Ambulatory Visit: Payer: Self-pay | Admitting: Cardiovascular Disease

## 2015-06-03 ENCOUNTER — Ambulatory Visit: Payer: Medicare Other | Admitting: Cardiovascular Disease

## 2015-08-22 ENCOUNTER — Ambulatory Visit: Payer: Medicare Other | Admitting: Cardiovascular Disease

## 2015-11-05 DIAGNOSIS — I119 Hypertensive heart disease without heart failure: Secondary | ICD-10-CM | POA: Diagnosis not present

## 2015-11-05 DIAGNOSIS — E1140 Type 2 diabetes mellitus with diabetic neuropathy, unspecified: Secondary | ICD-10-CM | POA: Diagnosis not present

## 2015-11-05 DIAGNOSIS — I2510 Atherosclerotic heart disease of native coronary artery without angina pectoris: Secondary | ICD-10-CM | POA: Diagnosis not present

## 2015-11-05 DIAGNOSIS — E1165 Type 2 diabetes mellitus with hyperglycemia: Secondary | ICD-10-CM | POA: Diagnosis not present

## 2015-11-11 ENCOUNTER — Ambulatory Visit (INDEPENDENT_AMBULATORY_CARE_PROVIDER_SITE_OTHER): Payer: PPO | Admitting: Cardiovascular Disease

## 2015-11-11 ENCOUNTER — Encounter: Payer: Self-pay | Admitting: Cardiovascular Disease

## 2015-11-11 VITALS — BP 120/66 | HR 64 | Ht 63.5 in | Wt 155.8 lb

## 2015-11-11 DIAGNOSIS — I2510 Atherosclerotic heart disease of native coronary artery without angina pectoris: Secondary | ICD-10-CM

## 2015-11-11 DIAGNOSIS — I10 Essential (primary) hypertension: Secondary | ICD-10-CM | POA: Diagnosis not present

## 2015-11-11 NOTE — Patient Instructions (Signed)

## 2015-11-11 NOTE — Progress Notes (Signed)
Cardiology Office Note Date:  11/11/2015   [name] [name], DOB September 03, 1931, MRN [id number]  PCP:  Fredirick Maudlin, MD  Cardiologist:  Tonny Bollman, MD    Chief Complaint  Patient presents with  . Coronary Artery Disease    denies cp, sob, le edema, or claudication  . Hypertension    History of Present Illness: [name] [name] is a 82 y.o. female who presents for followup of coronary artery disease. She initially underwent PCI in 2009 when she was found to have severe stenosis of the right coronary artery treated with a 3.5 x 18 mm Promus DES. She underwent cardiac catheterization in 2014 when she presented with chest pain symptoms concerning for unstable angina. This demonstrated patent coronary arteries with no significant restenosis of her previously placed stent and nonobstructive disease elsewhere. The patient has type 2 diabetes and is treated with insulin.  The patient is doing well from a symptomatic standpoint. Other than some problems with glycemic control, she is doing well. She denies chest pain, shortness of breath, edema, orthopnea, or PND. She's compliant with her medications.  Past Medical History  Diagnosis Date  . Diabetes mellitus   . Thyroid disease   . Hypertension   . CAD (coronary artery disease)     DES tor RCA 2009.  75% ostial first diagonal stenosis, 30% proximal LAD stenosis, 85% proximal right coronary artery stenosis treated with Promus 3.5 x 18 stent.  . Palpitations   . Hyperlipidemia   . PE (pulmonary embolism)     Past Surgical History  Procedure Laterality Date  . Abdominal hysterectomy    . Cholecystectomy    . Tonsillectomy    . Appendectomy    . Abdominal cavity operation      Obstruction  . Left heart catheterization with coronary angiogram N/A 02/20/2013    Procedure: LEFT HEART CATHETERIZATION WITH CORONARY ANGIOGRAM;  Surgeon: Tonny Bollman, MD;  Location: [facility] CATH LAB;  Service: Cardiovascular;  Laterality: N/A;    Current  Outpatient Prescriptions  Medication Sig Dispense Refill  . amiloride-hydrochlorothiazide (MODURETIC) 5-50 MG tablet Take 1 tablet by mouth daily.      Marland Kitchen aspirin EC 81 MG tablet Take 81 mg by mouth daily.    . fenofibrate (TRICOR) 145 MG tablet Take 145 mg by mouth daily.     Marland Kitchen gabapentin (NEURONTIN) 300 MG capsule Take 300 mg by mouth daily.    . insulin glargine (LANTUS) 100 UNIT/ML injection Inject 70 Units into the skin at bedtime.     . insulin lispro (HUMALOG) 100 UNIT/ML cartridge Inject 15-30 Units into the skin 3 (three) times daily with meals. Inject 30 units into the skin with breakfast, inject 15 units into the skin with lunch, and inject 30 units into the skin with dinner.    . levothyroxine (SYNTHROID, LEVOTHROID) 175 MCG tablet Take 175 mcg by mouth daily.     . metoprolol (LOPRESSOR) 50 MG tablet TAKE ONE TABLET BY MOUTH TWICE A DAY 60 tablet 5  . nitroGLYCERIN (NITROSTAT) 0.4 MG SL tablet Place 0.4 mg under the tongue every 5 (five) minutes as needed for chest pain (MAX of 3 doses).    . rosuvastatin (CRESTOR) 40 MG tablet Take 40 mg by mouth at bedtime.      No current facility-administered medications for this visit.    Allergies:   Review of patient's allergies indicates no known allergies.   Social History:  The patient  reports that she has never smoked.  She does not have any smokeless tobacco history on file. She reports that she does not drink alcohol or use illicit drugs.   Family History:  The patient's  family history includes Aneurysm in her brother; Cancer in her father and mother.    ROS:  Please see the history of present illness.  Otherwise, review of systems is positive for diarrhea.  All other systems are reviewed and negative.    PHYSICAL EXAM: VS:  BP 120/66 mmHg  Pulse 64  Ht 5' 3.5" (1.613 m)  Wt 155 lb 12.8 oz (70.67 kg)  BMI 27.16 kg/m2 , BMI Body mass index is 27.16 kg/(m^2). GEN: Well nourished, well developed, pleasant elderly woman in no  acute distress HEENT: normal Neck: no JVD, no masses. No carotid bruits Cardiac: RRR without murmur or gallop                Respiratory:  clear to auscultation bilaterally, normal work of breathing GI: soft, nontender, nondistended, + BS MS: no deformity or atrophy Ext: no pretibial edema, pedal pulses 2+= bilaterally Skin: warm and dry, no rash Neuro:  Strength and sensation are intact Psych: euthymic mood, full affect  EKG:  EKG is ordered today. The ekg ordered today shows NSR with first degree AV block, low voltage QRS - no change from previous tracings  Recent Labs: No results found for requested labs within last 365 days.   Lipid Panel     Component Value Date/Time   CHOL 111 02/19/2013 0335   TRIG 352* 02/19/2013 0335   HDL 31* 02/19/2013 0335   CHOLHDL 3.6 02/19/2013 0335   VLDL 70* 02/19/2013 0335   LDLCALC 10 02/19/2013 0335      Wt Readings from Last 3 Encounters:  11/11/15 155 lb 12.8 oz (70.67 kg)  10/25/14 162 lb 12.8 oz (73.846 kg)  04/04/14 159 lb 12.8 oz (72.485 kg)    ASSESSMENT AND PLAN: 1.  CAD, native vessel, with angina pectoris: symptoms seem to be improved. Will continue current Rx (ASA, metoprolol, statin drug).  2. Essential hypertension: BP under ideal control - current Rx reviewed and will be continued.  3. Hyperlipidemia: treated with Tricor and Crestor  4. Type 2 diabetes: states glycemic control is not that good - attributes this to her diet.  Current medicines are reviewed with the patient today.  The patient does not have concerns regarding medicines.  Labs/ tests ordered today include:   Orders Placed This Encounter  Procedures  . EKG 12-Lead    Disposition:   FU one year  Signed, Tonny Bollmanooper, Grayden Burley, MD  11/11/2015 5:20 PM    [facility] [address], [place], [place]  [address] Phone: [phone]) [phone]; Fax: [phone]

## 2015-11-12 ENCOUNTER — Telehealth: Payer: Self-pay | Admitting: Cardiovascular Disease

## 2015-11-12 NOTE — Telephone Encounter (Signed)
error 

## 2015-11-18 ENCOUNTER — Encounter: Payer: Self-pay | Admitting: Internal Medicine

## 2015-12-16 ENCOUNTER — Other Ambulatory Visit: Payer: Self-pay | Admitting: Cardiovascular Disease

## 2015-12-17 ENCOUNTER — Other Ambulatory Visit: Payer: Self-pay | Admitting: Cardiovascular Disease

## 2016-02-05 DIAGNOSIS — I119 Hypertensive heart disease without heart failure: Secondary | ICD-10-CM | POA: Diagnosis not present

## 2016-02-05 DIAGNOSIS — E1165 Type 2 diabetes mellitus with hyperglycemia: Secondary | ICD-10-CM | POA: Diagnosis not present

## 2016-02-05 DIAGNOSIS — E1140 Type 2 diabetes mellitus with diabetic neuropathy, unspecified: Secondary | ICD-10-CM | POA: Diagnosis not present

## 2016-02-05 DIAGNOSIS — I10 Essential (primary) hypertension: Secondary | ICD-10-CM | POA: Diagnosis not present

## 2016-07-01 DIAGNOSIS — H3530 Unspecified macular degeneration: Secondary | ICD-10-CM | POA: Diagnosis not present

## 2016-07-01 DIAGNOSIS — H52203 Unspecified astigmatism, bilateral: Secondary | ICD-10-CM | POA: Diagnosis not present

## 2016-07-01 DIAGNOSIS — Z961 Presence of intraocular lens: Secondary | ICD-10-CM | POA: Diagnosis not present

## 2016-07-01 DIAGNOSIS — H31011 Macula scars of posterior pole (postinflammatory) (post-traumatic), right eye: Secondary | ICD-10-CM | POA: Diagnosis not present

## 2016-07-01 DIAGNOSIS — E109 Type 1 diabetes mellitus without complications: Secondary | ICD-10-CM | POA: Diagnosis not present

## 2016-07-01 DIAGNOSIS — H524 Presbyopia: Secondary | ICD-10-CM | POA: Diagnosis not present

## 2016-07-01 DIAGNOSIS — H5203 Hypermetropia, bilateral: Secondary | ICD-10-CM | POA: Diagnosis not present

## 2016-07-01 DIAGNOSIS — H43813 Vitreous degeneration, bilateral: Secondary | ICD-10-CM | POA: Diagnosis not present

## 2016-11-12 ENCOUNTER — Encounter: Payer: Self-pay | Admitting: Cardiovascular Disease

## 2016-11-30 ENCOUNTER — Encounter: Payer: Self-pay | Admitting: Cardiovascular Disease

## 2016-11-30 ENCOUNTER — Ambulatory Visit (INDEPENDENT_AMBULATORY_CARE_PROVIDER_SITE_OTHER): Payer: PPO | Admitting: Cardiovascular Disease

## 2016-11-30 VITALS — BP 150/68 | HR 74 | Ht 63.5 in | Wt 141.4 lb

## 2016-11-30 DIAGNOSIS — I2510 Atherosclerotic heart disease of native coronary artery without angina pectoris: Secondary | ICD-10-CM | POA: Diagnosis not present

## 2016-11-30 DIAGNOSIS — I10 Essential (primary) hypertension: Secondary | ICD-10-CM

## 2016-11-30 MED ORDER — LOSARTAN POTASSIUM 25 MG PO TABS: 25.0000 mg | ORAL_TABLET | Freq: Every day | ORAL | 3 refills | Status: AC
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
## 2016-11-30 NOTE — Patient Instructions (Addendum)
Medication Instructions:  Your physician has recommended you make the following change in your medication:  1. START Losartan  take one tablet by mouth daily  Labwork: Your physician recommends that you return for lab work in: 2 WEEKS (BMP), order given to the pt to have this drawn at [facility] in [place] on 12/14/16  Testing/Procedures: No new orders.   Follow-Up: Your physician wants you to follow-up in: 1 YEAR with Dr Excell Seltzer.  You will receive a reminder letter in the mail two months in advance. If you don't receive a letter, please call our office to schedule the follow-up appointment.   Any Other Special Instructions Will Be Listed Below (If Applicable).     If you need a refill on your cardiac medications before your next appointment, please call your pharmacy.

## 2017-01-02 ENCOUNTER — Other Ambulatory Visit: Payer: Self-pay | Admitting: Cardiovascular Disease

## 2017-01-06 ENCOUNTER — Observation Stay (HOSPITAL_COMMUNITY): Payer: PPO

## 2017-01-06 ENCOUNTER — Observation Stay (HOSPITAL_COMMUNITY)
Admission: EM | Admit: 2017-01-06 | Discharge: 2017-01-08 | Disposition: A | Discharge status: Home / Self Care | Payer: PPO | Attending: Internal Medicine | Admitting: Internal Medicine

## 2017-01-06 ENCOUNTER — Encounter (HOSPITAL_COMMUNITY): Payer: Self-pay | Admitting: Emergency Medicine

## 2017-01-06 ENCOUNTER — Emergency Department (HOSPITAL_COMMUNITY): Payer: PPO

## 2017-01-06 DIAGNOSIS — I10 Essential (primary) hypertension: Secondary | ICD-10-CM | POA: Diagnosis present

## 2017-01-06 DIAGNOSIS — E1122 Type 2 diabetes mellitus with diabetic chronic kidney disease: Secondary | ICD-10-CM

## 2017-01-06 DIAGNOSIS — Z86711 Personal history of pulmonary embolism: Secondary | ICD-10-CM | POA: Diagnosis not present

## 2017-01-06 DIAGNOSIS — Z794 Long term (current) use of insulin: Secondary | ICD-10-CM | POA: Diagnosis not present

## 2017-01-06 DIAGNOSIS — G458 Other transient cerebral ischemic attacks and related syndromes: Secondary | ICD-10-CM

## 2017-01-06 DIAGNOSIS — I6521 Occlusion and stenosis of right carotid artery: Secondary | ICD-10-CM | POA: Insufficient documentation

## 2017-01-06 DIAGNOSIS — Z79899 Other long term (current) drug therapy: Secondary | ICD-10-CM | POA: Insufficient documentation

## 2017-01-06 DIAGNOSIS — E1151 Type 2 diabetes mellitus with diabetic peripheral angiopathy without gangrene: Secondary | ICD-10-CM | POA: Diagnosis not present

## 2017-01-06 DIAGNOSIS — E1165 Type 2 diabetes mellitus with hyperglycemia: Secondary | ICD-10-CM | POA: Diagnosis not present

## 2017-01-06 DIAGNOSIS — Z955 Presence of coronary angioplasty implant and graft: Secondary | ICD-10-CM | POA: Insufficient documentation

## 2017-01-06 DIAGNOSIS — Z90710 Acquired absence of both cervix and uterus: Secondary | ICD-10-CM | POA: Diagnosis not present

## 2017-01-06 DIAGNOSIS — Z8249 Family history of ischemic heart disease and other diseases of the circulatory system: Secondary | ICD-10-CM | POA: Insufficient documentation

## 2017-01-06 DIAGNOSIS — Z801 Family history of malignant neoplasm of trachea, bronchus and lung: Secondary | ICD-10-CM | POA: Insufficient documentation

## 2017-01-06 DIAGNOSIS — E039 Hypothyroidism, unspecified: Secondary | ICD-10-CM | POA: Diagnosis not present

## 2017-01-06 DIAGNOSIS — M47812 Spondylosis without myelopathy or radiculopathy, cervical region: Secondary | ICD-10-CM | POA: Insufficient documentation

## 2017-01-06 DIAGNOSIS — Z9049 Acquired absence of other specified parts of digestive tract: Secondary | ICD-10-CM | POA: Insufficient documentation

## 2017-01-06 DIAGNOSIS — I2510 Atherosclerotic heart disease of native coronary artery without angina pectoris: Secondary | ICD-10-CM | POA: Diagnosis not present

## 2017-01-06 DIAGNOSIS — E781 Pure hyperglyceridemia: Secondary | ICD-10-CM | POA: Insufficient documentation

## 2017-01-06 DIAGNOSIS — G459 Transient cerebral ischemic attack, unspecified: Principal | ICD-10-CM | POA: Diagnosis present

## 2017-01-06 DIAGNOSIS — E785 Hyperlipidemia, unspecified: Secondary | ICD-10-CM | POA: Insufficient documentation

## 2017-01-06 DIAGNOSIS — R200 Anesthesia of skin: Secondary | ICD-10-CM | POA: Diagnosis not present

## 2017-01-06 DIAGNOSIS — I700 Atherosclerosis of aorta: Secondary | ICD-10-CM | POA: Diagnosis not present

## 2017-01-06 DIAGNOSIS — E119 Type 2 diabetes mellitus without complications: Secondary | ICD-10-CM

## 2017-01-06 LAB — COMPREHENSIVE METABOLIC PANEL
ALBUMIN: 3.7 g/dL (ref 3.5–5.0)
ALT: 23 U/L (ref 14–54)
ANION GAP: 10 (ref 5–15)
AST: 34 U/L (ref 15–41)
Alkaline Phosphatase: 104 U/L (ref 38–126)
BUN: 17 mg/dL (ref 6–20)
CO2: 25 mmol/L (ref 22–32)
Calcium: 8.6 mg/dL — ABNORMAL LOW (ref 8.9–10.3)
Chloride: 101 mmol/L (ref 101–111)
Creatinine, Ser: 1.1 mg/dL — ABNORMAL HIGH (ref 0.44–1.00)
GFR calc non Af Amer: 45 mL/min — ABNORMAL LOW (ref >60)
GFR, EST AFRICAN AMERICAN: 53 mL/min — AB (ref >60)
GLUCOSE: 211 mg/dL — AB (ref 65–99)
POTASSIUM: 4.9 mmol/L (ref 3.5–5.1)
SODIUM: 136 mmol/L (ref 135–145)
TOTAL PROTEIN: 7.4 g/dL (ref 6.5–8.1)
Total Bilirubin: 0.7 mg/dL (ref 0.3–1.2)

## 2017-01-06 LAB — DIFFERENTIAL
Basophils Absolute: 0.1 10*3/uL (ref 0.0–0.1)
Basophils Relative: 1 %
EOS ABS: 0.2 10*3/uL (ref 0.0–0.7)
Eosinophils Relative: 2 %
Lymphocytes Relative: 19 %
Lymphs Abs: 2.1 10*3/uL (ref 0.7–4.0)
Monocytes Absolute: 0.7 10*3/uL (ref 0.1–1.0)
Monocytes Relative: 7 %
NEUTROS PCT: 71 %
Neutro Abs: 7.8 10*3/uL — ABNORMAL HIGH (ref 1.7–7.7)

## 2017-01-06 LAB — CBC
HCT: 41.7 % (ref 36.0–46.0)
Hemoglobin: 14.1 g/dL (ref 12.0–15.0)
MCH: 31 pg (ref 26.0–34.0)
MCHC: 33.8 g/dL (ref 30.0–36.0)
MCV: 91.6 fL (ref 78.0–100.0)
Platelets: 227 K/uL (ref 150–400)
RBC: 4.55 MIL/uL (ref 3.87–5.11)
RDW: 12.7 % (ref 11.5–15.5)
WBC: 10.8 K/uL — ABNORMAL HIGH (ref 4.0–10.5)

## 2017-01-06 LAB — I-STAT CHEM 8, ED
BUN: 20 mg/dL (ref 6–20)
Calcium, Ion: 1.13 mmol/L — ABNORMAL LOW (ref 1.15–1.40)
Chloride: 102 mmol/L (ref 101–111)
Creatinine, Ser: 1.1 mg/dL — ABNORMAL HIGH (ref 0.44–1.00)
Glucose, Bld: 210 mg/dL — ABNORMAL HIGH (ref 65–99)
HCT: 42 % (ref 36.0–46.0)
Hemoglobin: 14.3 g/dL (ref 12.0–15.0)
Potassium: 4.8 mmol/L (ref 3.5–5.1)
Sodium: 138 mmol/L (ref 135–145)
TCO2: 27 mmol/L (ref 0–100)

## 2017-01-06 LAB — PROTIME-INR
INR: 0.94
PROTHROMBIN TIME: 12.5 s (ref 11.4–15.2)

## 2017-01-06 LAB — GLUCOSE, CAPILLARY: Glucose-Capillary: 245 mg/dL — ABNORMAL HIGH (ref 65–99)

## 2017-01-06 LAB — APTT: aPTT: 25 seconds (ref 24–36)

## 2017-01-06 LAB — TROPONIN I: Troponin I: <0.03 ng/mL

## 2017-01-06 LAB — CBG MONITORING, ED: GLUCOSE-CAPILLARY: 199 mg/dL — AB (ref 65–99)

## 2017-01-06 MED ORDER — ROSUVASTATIN CALCIUM 20 MG PO TABS
40.0000 mg | ORAL_TABLET | Freq: Every day | ORAL | Status: DC
  Administered 2017-01-06 – 2017-01-07 (×2): 40 mg via ORAL
  Filled 2017-01-06 (×2): qty 2

## 2017-01-06 MED ORDER — LEVOTHYROXINE SODIUM 75 MCG PO TABS
150.0000 ug | ORAL_TABLET | Freq: Every day | ORAL | Status: DC
  Administered 2017-01-07 – 2017-01-08 (×2): 150 ug via ORAL
  Filled 2017-01-06: qty 2
  Filled 2017-01-06: qty 6
  Filled 2017-01-06: qty 2

## 2017-01-06 MED ORDER — INSULIN ASPART 100 UNIT/ML ~~LOC~~ SOLN
0.0000 [IU] | Freq: Three times a day (TID) | SUBCUTANEOUS | Status: DC
  Administered 2017-01-07 (×2): 3 [IU] via SUBCUTANEOUS
  Administered 2017-01-07: 8 [IU] via SUBCUTANEOUS
  Administered 2017-01-08: 5 [IU] via SUBCUTANEOUS
  Administered 2017-01-08: 11 [IU] via SUBCUTANEOUS

## 2017-01-06 MED ORDER — STROKE: EARLY STAGES OF RECOVERY BOOK
Freq: Once | Status: AC
  Administered 2017-01-06: 19:00:00

## 2017-01-06 MED ORDER — ASPIRIN 325 MG PO TABS
325.0000 mg | ORAL_TABLET | Freq: Every day | ORAL | Status: DC
  Administered 2017-01-06 – 2017-01-08 (×3): 325 mg via ORAL
  Filled 2017-01-06 (×3): qty 1

## 2017-01-06 MED ORDER — ENOXAPARIN SODIUM 40 MG/0.4ML ~~LOC~~ SOLN
40.0000 mg | SUBCUTANEOUS | Status: DC
  Administered 2017-01-06 – 2017-01-07 (×2): 40 mg via SUBCUTANEOUS
  Filled 2017-01-06 (×2): qty 0.4

## 2017-01-06 MED ORDER — INSULIN GLARGINE 100 UNIT/ML ~~LOC~~ SOLN
20.0000 [IU] | Freq: Once | SUBCUTANEOUS | Status: AC
  Administered 2017-01-07: 20 [IU] via SUBCUTANEOUS
  Filled 2017-01-06: qty 0.2

## 2017-01-06 MED ORDER — INSULIN ASPART 100 UNIT/ML ~~LOC~~ SOLN
4.0000 [IU] | Freq: Once | SUBCUTANEOUS | Status: AC
  Administered 2017-01-06: 4 [IU] via SUBCUTANEOUS

## 2017-01-06 MED ORDER — ASPIRIN 300 MG RE SUPP: 300.0000 mg | Freq: Every day | RECTAL | Status: DC

## 2017-01-06 MED ORDER — HYDRALAZINE HCL 20 MG/ML IJ SOLN: 10.0000 mg | Freq: Four times a day (QID) | INTRAMUSCULAR | Status: DC | PRN

## 2017-01-06 NOTE — ED Triage Notes (Addendum)
Pt reports went to bed at 10pm last night. Pt reports woke up at 3am with left arm numbness and right sided facial numbness. Pt alert and oriented. Speech clear. Pt able to move all extremities to command. Ambulates with steady gait. Airway patent. CBG 199.

## 2017-01-06 NOTE — Progress Notes (Signed)
PT Cancellation Note  Patient Details Name: Nehemiah Massedlma S Pilz MRN: [id number] DOB: 09/06/1931   Cancelled Treatment:    Reason Eval/Treat Not Completed: Patient at procedure or test/unavailable;Fatigue/lethargy limiting ability to participate.  Attempted to see patient x2 today.  Will return tomorrow for PT evaluation.   Vena AustriaSusan H Alaze Garverick 01/06/2017, 9:39 PM Durenda HurtSusan H. Renaldo Fiddleravis, PT, [facility] Acute Rehab Services Pager [phone]

## 2017-01-06 NOTE — Progress Notes (Signed)
Lovenox for VTE prophylaxis per pharmacy ordered.  Patient weighs 63.5kg, SCr 1.1 with CrCl ~30-7635mL/min.  Plan: Lovenox 40mg  subQ q24h- pharmacy will adjust if CrCl drops below 1630mL/min. Pharmacy will sign off  Keiyon Plack D. Ivi Griffith, PharmD, BCPS Clinical Pharmacist 01/06/2017 7:55 PM

## 2017-01-06 NOTE — H&P (Addendum)
[facility] History and Physical  [name] [name] ZOX:[id number] DOB: 1931-10-30 DOA: 01/06/2017   PCP: Kari Baars, MD  Specialists: Dr. Excell Seltzer is her cardiologist  Chief Complaint: Left-sided numbness  HPI: [name] [name] is a 83 y.o. female with a past medical history of coronary artery disease with a stent to the RCA placed in 2009, history of essential hypertension, type 2 diabetes on insulin who was in her usual state of health until about 3 AM this morning when she woke up with numbness around her mouth and then from arm to fingers and left leg. She got out of bed to her rocking chair. Did not have any weakness in the left arm or leg. She sat on the chair for about 40 minutes. Symptoms started getting better. She went back to the bed and went to sleep. She woke up around 7:30 this morning. Still had some symptoms of numbness on the left side. She called her primary care physician's office and was asked to go to the emergency department. Her symptoms of numbness have resolved. She does feel somewhat weak on that left side. Denies any difficulty speaking. No difficulty swallowing. Denies any chest pain, shortness of breath. No nausea or vomiting. No seizure type activity. No headaches.  In the emergency department she was found to have a blood pressure of 185/73. Other vital signs were stable. CT head did not show any acute findings. EKG showed sinus rhythm. She was thought to have a TIA or stroke. She was hospitalized for further management.  Home Medications: Prior to Admission medications   Medication Sig Start Date End Date Taking? Authorizing Provider  amiloride-hydrochlorothiazide (MODURETIC) 5-50 MG tablet Take 1 tablet by mouth daily.     Yes [provider]  insulin lispro (HUMALOG) 100 UNIT/ML cartridge Inject 15-30 Units into the skin 3 (three) times daily with meals. Inject 30 units into the skin with breakfast, inject 15 units into the skin with lunch, and inject  30 units into the skin with dinner.   Yes [provider]  levothyroxine (SYNTHROID, LEVOTHROID) 150 MCG tablet Take 1 tablet by mouth daily before breakfast. 12/17/16  Yes [provider]  losartan (COZAAR) 25 MG tablet Take 1 tablet (25 mg total) by mouth daily. 11/30/16 02/28/17 Yes Tonny Bollman, MD  metoprolol tartrate (LOPRESSOR) 50 MG tablet TAKE ONE TABLET BY MOUTH TWICE A DAY 01/05/17  Yes Tonny Bollman, MD  nitroGLYCERIN (NITROSTAT) 0.4 MG SL tablet Place 0.4 mg under the tongue every 5 (five) minutes as needed for chest pain (MAX of 3 doses).    [provider]    Allergies: No Known Allergies  Past Medical History: Past Medical History:  Diagnosis Date  . CAD (coronary artery disease)    DES tor RCA 2009.  75% ostial first diagonal stenosis, 30% proximal LAD stenosis, 85% proximal right coronary artery stenosis treated with Promus 3.5 x 18 stent.  . Diabetes mellitus   . Hyperlipidemia   . Hypertension   . Palpitations   . PE (pulmonary embolism)   . Thyroid disease     Past Surgical History:  Procedure Laterality Date  . Abdominal cavity operation     Obstruction  . ABDOMINAL HYSTERECTOMY    . APPENDECTOMY    . CHOLECYSTECTOMY    . LEFT HEART CATHETERIZATION WITH CORONARY ANGIOGRAM N/A 02/20/2013   Procedure: LEFT HEART CATHETERIZATION WITH CORONARY ANGIOGRAM;  Surgeon: Tonny Bollman, MD;  Location: [facility] CATH LAB;  Service: Cardiovascular;  Laterality: N/A;  .  TONSILLECTOMY      Social History: Lives with her husband. Denies smoking, alcohol use or illicit drug use. Quite independent with daily activities.  Family History:  Family History  Problem Relation Age of Onset  . Cancer Mother        lung  . Cancer Father        lung  . Aneurysm Brother   . Coronary artery disease Unknown   . Hypertension Unknown      Review of Systems - History obtained from the patient General ROS: positive for  - fatigue Psychological ROS:  negative Ophthalmic ROS: negative ENT ROS: negative Allergy and Immunology ROS: negative Hematological and Lymphatic ROS: negative Endocrine ROS: negative Respiratory ROS: no cough, shortness of breath, or wheezing Cardiovascular ROS: no chest pain or dyspnea on exertion Gastrointestinal ROS: no abdominal pain, change in bowel habits, or black or bloody stools Genito-Urinary ROS: no dysuria, trouble voiding, or hematuria Musculoskeletal ROS: negative Neurological ROS: as in hpi Dermatological ROS: negative  Physical Examination  Vitals:   01/06/17 1444 01/06/17 1500 01/06/17 1530 01/06/17 1556  BP:  (!) 163/78 (!) 175/75 (!) 175/75  Pulse:  (!) 59 66 66  Resp:  (!) 8 16 16   Temp: 98.2 F (36.8 C)   98.2 F (36.8 C)  TempSrc:      SpO2:  95% 100% 100%  Weight:      Height:        BP (!) 175/75   Pulse 66   Temp 98.2 F (36.8 C)   Resp 16   Ht 5' 3.5" (1.613 m)   Wt 63.5 kg (140 lb)   SpO2 100%   BMI 24.41 kg/m   General appearance: alert, cooperative, appears stated age and no distress Head: Normocephalic, without obvious abnormality, atraumatic Eyes: conjunctivae/corneas clear. PERRL, EOM's intact. . Throat: lips, mucosa, and tongue normal; teeth and gums normal Neck: no adenopathy. Carotid bruit appreciated on the right. No JVD. Resp: clear to auscultation bilaterally Cardio: regular rate and rhythm, S1, S2 normal, no murmur, click, rub or gallop GI: soft, non-tender; bowel sounds normal; no masses,  no organomegaly Extremities: extremities normal, atraumatic, no cyanosis or edema Pulses: 2+ and symmetric Skin: Skin color, texture, turgor normal. No rashes or lesions Lymph nodes: Cervical, supraclavicular, and axillary nodes normal. Neurologic: Awake and alert. Oriented 3. Cranial 2-12 intact. No pronator drift. Slightly abnormal cerebellar sign in the left upper extremity. Strength equal bilateral upper and lower extremity.   Labs on Admission: I have  personally reviewed following labs and imaging studies  CBC:  Recent Labs Lab 01/06/17 1207 01/06/17 1216  WBC 10.8*  --   NEUTROABS 7.8*  --   HGB 14.1 14.3  HCT 41.7 42.0  MCV 91.6  --   PLT 227  --    Basic Metabolic Panel:  Recent Labs Lab 01/06/17 1207 01/06/17 1216  NA 136 138  K 4.9 4.8  CL 101 102  CO2 25  --   GLUCOSE 211* 210*  BUN 17 20  CREATININE 1.10* 1.10*  CALCIUM 8.6*  --    GFR: Estimated Creatinine Clearance: 33.4 mL/min (A) (by C-G formula based on SCr of 1.1 mg/dL (H)). Liver Function Tests:  Recent Labs Lab 01/06/17 1207  AST 34  ALT 23  ALKPHOS 104  BILITOT 0.7  PROT 7.4  ALBUMIN 3.7   Coagulation Profile:  Recent Labs Lab 01/06/17 1207  INR 0.94   Cardiac Enzymes:  Recent Labs Lab 01/06/17 1207  TROPONINI <0.03   CBG:  Recent Labs Lab 01/06/17 1205  GLUCAP 199*    Radiological Exams on Admission: Ct Head Wo Contrast  Result Date: 01/06/2017 CLINICAL DATA:  Awoke this morning with left arm and right facial numbness. No known injury. EXAM: CT HEAD WITHOUT CONTRAST TECHNIQUE: Contiguous axial images were obtained from the base of the skull through the vertex without intravenous contrast. COMPARISON:  Head CT - 04/14/2012; brain MRI - 04/14/2012 FINDINGS: Brain: Grossly unchanged scattered periventricular hypodensities compatible microvascular ischemic disease. The gray-white differentiation is otherwise well maintained without CT evidence of acute large territory infarct. No intraparenchymal or extra-axial mass or hemorrhage. Normal size and configuration of the ventricles and the basilar cisterns. No midline shift. Punctate dystrophic calcification within the right-side of the pons (image 7, series 2) is unchanged. Vascular: Atherosclerotic plaque when the bilateral carotid siphons. Skull: No displaced calvarial fracture. Sinuses/Orbits: Limited visualization the paranasal sinuses and mastoid air cells is normal. No air-fluid  levels. A minimal debris is noted within the right external auditory canal. Post bilateral cataract surgery. Other: Regional soft tissues appear normal. IMPRESSION: Microvascular ischemic disease without acute intracranial process. Electronically Signed   By: Simonne Come [name]   On: 01/06/2017 13:24    My interpretation of Electrocardiogram: Sinus rhythm in the 50s. Normal axis. Intervals are normal. No concerning ST or T waves.    Problem List  Principal Problem:   TIA (transient ischemic attack) Active Problems:   Hypothyroidism   DM II (diabetes mellitus, type II), controlled (HCC)   Essential hypertension   Assessment: This is a 83 year old Caucasian female with past history as stated earlier, who presents with onset of numbness involving her mouth, left arm and left leg starting at 3 AM this morning. Her symptoms are mostly resolved. She has perhaps a subtle abnormality in the left upper extremity with her cerebellar signs. But no motor deficits appreciated. This is most likely a TIA. Patient also has a right carotid bruit.  Plan: #1 TIA versus stroke/right carotid bruit: MRI brain, MRA head. Carotid Doppler. Echocardiogram. EKG shows sinus rhythm. Lipid panel. Continue aspirin. She tells me that she used to be on aspirin but stopped taking it about a month ago. PTOT. According to the nurse she has passed her swallow screen.  #2 history of hyperlipidemia: Check lipid panel. Home medication list does not mention Crestor or TriCor. However, based on cardiology note from April 23, she is on both these drugs. Patient not sure of exactly what medicine she takes, but denies any side effects to her cholesterol medicines and thinks that she is on these medicines at home. We will initiate Crestor for now.  #3 history of coronary artery disease: This appears to be stable. She denies any chest pain currently.  #4 Type 2 diabetes: Initiate sliding scale coverage. Check HbA1c.  #5 history of  essential hypertension: Hold her oral agents for now. Allow permissive hypertension.  #6. Hypothyroidism: Continue with home medications.   DVT Prophylaxis: Lovenox Code Status: Full code Family Communication: Discussed with the patient  Disposition Plan: Possibly home when ready for discharge  Consults called: Neurology  Admission status: Observation.   Further management decisions will depend on results of further testing and patient's response to treatment.   [facility]  [facility] Pager [phone]  If 7PM-7AM, please contact night-coverage [email or web address] Password [email or web address]  01/06/2017, 6:05 PM

## 2017-01-06 NOTE — Progress Notes (Signed)
Patient c/o 8/10 headache across forehead and behind eyes. Patient states she waited too long to eat and by the time she ate h/a had already set in. Patient denied pain at 1940. MD notified and RN will continue to monitor.

## 2017-01-06 NOTE — ED Provider Notes (Signed)
[facility]-EMERGENCY DEPT Provider Note   CSN: [id number] Arrival date & time: 01/06/17  1154  By signing my name below, I, [name] [name], attest that this documentation has been prepared under the direction and in the presence of Verdie MosherLiu, Neysa Bonitoana Duo, MD. Electronically Signed: Thelma BargeNick [name], Scribe. 01/06/17. 12:30 PM.   History   Chief Complaint Chief Complaint  Patient presents with  . Numbness   The history is provided by the patient. No language interpreter was used.    HPI Comments: [name] [name] is a 83 y.o. female who presents to the Emergency Department complaining of acute, rapid-onset numbness to her left side that occurred when she woke up at 3 AM. She states the left side around her mouth, her left leg, and left arm from her elbow to her fingertips felt numb. She has associated mild blurry vision. She also noted associated chest tightness, but this resolved.  She notes she went to bed at around 10:30pm with no symptoms, but her symptoms began at around 3:00am. She then sat in her rocking chair for a while and felt better and went back to sleep. She states her symptoms did not resolve completely but she did feel better.  She notes currently her left arm feels "funny" but it doesn't feel bad. She denies weakness, changes to speech, fevers, cough, vomiting, diarrhea, changes to bladder function, and dysuria.  Past Medical History:  Diagnosis Date  . CAD (coronary artery disease)    DES tor RCA 2009.  75% ostial first diagonal stenosis, 30% proximal LAD stenosis, 85% proximal right coronary artery stenosis treated with Promus 3.5 x 18 stent.  . Diabetes mellitus   . Hyperlipidemia   . Hypertension   . Palpitations   . PE (pulmonary embolism)   . Thyroid disease     Patient Active Problem List   Diagnosis Date Noted  . Chest pain with high risk for cardiac etiology 02/20/2013  . Old myocardial infarction 11/03/2011  . Shortness of breath 04/02/2010  . EDEMA 12/25/2009  . HYPERTENSION  12/24/2009  . CAD, NATIVE VESSEL 12/24/2009  . HYPOTHYROIDISM 03/12/2009  . DM 03/12/2009  . HYPERLIPIDEMIA-MIXED 03/12/2009  . History of pulmonary embolus (PE) 03/12/2009  . PALPITATIONS 03/12/2009  . CHEST PAIN-UNSPECIFIED 03/12/2009    Past Surgical History:  Procedure Laterality Date  . Abdominal cavity operation     Obstruction  . ABDOMINAL HYSTERECTOMY    . APPENDECTOMY    . CHOLECYSTECTOMY    . LEFT HEART CATHETERIZATION WITH CORONARY ANGIOGRAM N/A 02/20/2013   Procedure: LEFT HEART CATHETERIZATION WITH CORONARY ANGIOGRAM;  Surgeon: Tonny BollmanMichael Cooper, MD;  [facility];  Service: Cardiovascular;  Laterality: N/A;  . TONSILLECTOMY      OB History    No data available       Home Medications    Prior to Admission medications   Medication Sig Start Date End Date Taking? Authorizing Provider  amiloride-hydrochlorothiazide (MODURETIC) 5-50 MG tablet Take 1 tablet by mouth daily.     Yes [provider]  insulin lispro (HUMALOG) 100 UNIT/ML cartridge Inject 15-30 Units into the skin 3 (three) times daily with meals. Inject 30 units into the skin with breakfast, inject 15 units into the skin with lunch, and inject 30 units into the skin with dinner.   Yes [provider]  levothyroxine (SYNTHROID, LEVOTHROID) 150 MCG tablet Take 1 tablet by mouth daily before breakfast. 12/17/16  Yes [provider]  losartan (COZAAR) 25 MG tablet Take 1 tablet (25  mg total) by mouth daily. 11/30/16 02/28/17 Yes Tonny Bollman, MD  metoprolol tartrate (LOPRESSOR) 50 MG tablet TAKE ONE TABLET BY MOUTH TWICE A DAY 01/05/17  Yes Tonny Bollman, MD  nitroGLYCERIN (NITROSTAT) 0.4 MG SL tablet Place 0.4 mg under the tongue every 5 (five) minutes as needed for chest pain (MAX of 3 doses).    [provider]    Family History Family History  Problem Relation Age of Onset  . Cancer Mother        lung  . Cancer Father        lung  . Aneurysm Brother   .  Coronary artery disease Unknown   . Hypertension Unknown     Social History Social History  Substance Use Topics  . Smoking status: Never Smoker  . Smokeless tobacco: Never Used  . Alcohol use No     Allergies   Patient has no known allergies.   Review of Systems Review of Systems  Constitutional: Negative for fever.  Eyes: Positive for visual disturbance (blurry vision).  Respiratory: Positive for chest tightness (currently resolved). Negative for cough.   Gastrointestinal: Negative for diarrhea and vomiting.  Genitourinary: Negative for difficulty urinating, dysuria, frequency and hematuria.  Neurological: Positive for numbness. Negative for speech difficulty and weakness.  All other systems reviewed and are negative.    Physical Exam Updated Vital Signs BP (!) 171/73   Pulse 68   Temp 98.1 F (36.7 C) (Oral)   Resp 14   Ht 5' 3.5" (1.613 m)   Wt 63.5 kg (140 lb)   SpO2 100%   BMI 24.41 kg/m   Physical Exam Physical Exam  Nursing note and vitals reviewed. Constitutional: Well developed, well nourished, non-toxic, and in no acute distress Head: Normocephalic and atraumatic.  Mouth/Throat: Oropharynx is clear and moist.  Neck: Normal range of motion. Neck supple.  Cardiovascular: Normal rate and regular rhythm.   Pulmonary/Chest: Effort normal and breath sounds normal.  Abdominal: Soft. There is no tenderness. There is no rebound and no guarding.  Musculoskeletal: Normal range of motion.  Skin: Skin is warm and dry.  Psychiatric: Cooperative Neurological:  Alert, oriented to person, place, time, and situation. Memory grossly in tact. Fluent speech. No dysarthria or aphasia.  Cranial nerves: VF are full. Pupils are symmetric, and reactive to light. EOMI without nystagmus. No gaze deviation. Facial muscles symmetric with activation. Sensation to light touch over face in tact bilaterally. Hearing grossly in tact. Palate elevates symmetrically. Head turn and  shoulder shrug are intact. Tongue midline.  Reflexes defered.  Muscle bulk and tone normal. No pronator drift. Moves all extremities symmetrically. Sensation to light touch is in tact throughout in bilateral upper and lower extremities. She does report left hand "feeling funny" although sensation to light touch reported in tact.  Coordination reveals no dysmetria with finger to nose.   ED Treatments / Results  DIAGNOSTIC STUDIES: Oxygen Saturation is 100% on RA, normal by my interpretation.    COORDINATION OF CARE: 12:25 PM Discussed treatment plan with pt at bedside and pt agreed to plan.   Labs (all labs ordered are listed, but only abnormal results are displayed) Labs Reviewed  CBC - Abnormal; Notable for the following:       Result Value   WBC 10.8 (*)    All other components within normal limits  DIFFERENTIAL - Abnormal; Notable for the following:    Neutro Abs 7.8 (*)    All other components within normal limits  COMPREHENSIVE METABOLIC PANEL - Abnormal; Notable for the following:    Glucose, Bld 211 (*)    Creatinine, Ser 1.10 (*)    Calcium 8.6 (*)    GFR calc non Af Amer 45 (*)    GFR calc Af Amer 53 (*)    All other components within normal limits  CBG MONITORING, ED - Abnormal; Notable for the following:    Glucose-Capillary 199 (*)    All other components within normal limits  I-STAT CHEM 8, ED - Abnormal; Notable for the following:    Creatinine, Ser 1.10 (*)    Glucose, Bld 210 (*)    Calcium, Ion 1.13 (*)    All other components within normal limits  PROTIME-INR  APTT  TROPONIN I  CBG MONITORING, ED    EKG  EKG Interpretation  Date/Time:  Wednesday Jan 06 2017 12:07:02 EDT Ventricular Rate:  56 PR Interval:    QRS Duration: 84 QT Interval:  429 QTC Calculation: 414 R Axis:   -13 Text Interpretation:  Sinus rhythm Atrial premature complex Borderline prolonged PR interval Confirmed by Crista Curb [phone]) on 01/06/2017 12:27:22 PM        Radiology Ct Head Wo Contrast  Result Date: 01/06/2017 CLINICAL DATA:  Awoke this morning with left arm and right facial numbness. No known injury. EXAM: CT HEAD WITHOUT CONTRAST TECHNIQUE: Contiguous axial images were obtained from the base of the skull through the vertex without intravenous contrast. COMPARISON:  Head CT - 04/14/2012; brain MRI - 04/14/2012 FINDINGS: Brain: Grossly unchanged scattered periventricular hypodensities compatible microvascular ischemic disease. The gray-white differentiation is otherwise well maintained without CT evidence of acute large territory infarct. No intraparenchymal or extra-axial mass or hemorrhage. Normal size and configuration of the ventricles and the basilar cisterns. No midline shift. Punctate dystrophic calcification within the right-side of the pons (image 7, series 2) is unchanged. Vascular: Atherosclerotic plaque when the bilateral carotid siphons. Skull: No displaced calvarial fracture. Sinuses/Orbits: Limited visualization the paranasal sinuses and mastoid air cells is normal. No air-fluid levels. A minimal debris is noted within the right external auditory canal. Post bilateral cataract surgery. Other: Regional soft tissues appear normal. IMPRESSION: Microvascular ischemic disease without acute intracranial process. Electronically Signed   By: Simonne Come [name]   On: 01/06/2017 13:24    Procedures Procedures (including critical care time)  Medications Ordered in ED Medications - No data to display   Initial Impression / Assessment and Plan / ED Course  I have reviewed the triage vital signs and the nursing notes.  Pertinent labs & imaging results that were available during my care of the patient were reviewed by me and considered in my medical decision making (see chart for details).    Concern for TIA versus stroke. Most of her symptoms had fully resolved aside from "feeling funny" in her left hand. Her neuro exam is in tact otherwise.  She is out of the window for TPA. CT had visualized and shows no acute intracranial hemorrhage or other acute processes. Discussed with Dr. Otelia Limes, recommending admission to [facility] for CVA/TIA work-up. Spoke with Dr. Rinaldo Ratel, who accepted patient for admission.  Final Clinical Impressions(s) / ED Diagnoses   Final diagnoses:  Other specified transient cerebral ischemias    New Prescriptions New Prescriptions   No medications on file   I personally performed the services described in this documentation, which was scribed in my presence. The recorded information has been reviewed and is accurate.    Crista Curb  Duo, MD 01/06/17 1415

## 2017-01-06 NOTE — ED Notes (Signed)
Per Dr. Verdie MosherLiu. Two hour neuro checks. Minute vitals.

## 2017-01-06 NOTE — Progress Notes (Signed)
Received call to admit patient from Dr. Verdie MosherLiu.  Admission orders placed and when provider went to ED to assess patient she was not in her room.  [facility]. Unfortunately patient had been transferred prior to evaluation.  Per Dr. Theodora BlowLiu's H&P patient presented with an acute, rapid onset of numbness on her left side that began when she woke up around 3am.  She did voice some improvement at that time which is why she did not come to the ED.  She went back to sleep at that time.  EDP did discuss case with Dr. Otelia LimesLindzen of neurology who recommended admission and TIA/ CVA workup.

## 2017-01-06 NOTE — Consult Note (Signed)
Admission H&P    Chief Complaint: New-onset left-sided numbness.  HPI: [name] [name] is an 83 y.o. female with a history of diabetes mellitus, hypertension, hyperlipidemia, coronary artery disease and hypothyroidism, transferred from [facility] for management of possible acute stroke. Patient woke up at 3 AM with numbness involving the left hand which spread to involve left lower face and left leg. She did not experience weakness. Symptoms have markedly improved without treatment intervention. She has no history of stroke or TIA. CT scan of her head showed no acute intracranial abnormality. NIH stroke score at the time of this evaluation was 0.  LSN: 10:30 PM on 01/05/2017. tPA Given: No: Deficits rapidly improved mRankin:  Past Medical History:  Diagnosis Date  . CAD (coronary artery disease)    DES tor RCA 2009.  75% ostial first diagonal stenosis, 30% proximal LAD stenosis, 85% proximal right coronary artery stenosis treated with Promus 3.5 x 18 stent.  . Diabetes mellitus   . Hyperlipidemia   . Hypertension   . Palpitations   . PE (pulmonary embolism)   . Thyroid disease     Past Surgical History:  Procedure Laterality Date  . Abdominal cavity operation     Obstruction  . ABDOMINAL HYSTERECTOMY    . APPENDECTOMY    . CHOLECYSTECTOMY    . LEFT HEART CATHETERIZATION WITH CORONARY ANGIOGRAM N/A 02/20/2013   Procedure: LEFT HEART CATHETERIZATION WITH CORONARY ANGIOGRAM;  Surgeon: Sherren Mocha, MD;  Location: [facility] CATH LAB;  Service: Cardiovascular;  Laterality: N/A;  . TONSILLECTOMY      Family History  Problem Relation Age of Onset  . Cancer Mother        lung  . Cancer Father        lung  . Aneurysm Brother   . Coronary artery disease Unknown   . Hypertension Unknown    Social History:  reports that she has never smoked. She has never used smokeless tobacco. She reports that she does not drink alcohol or use drugs.  Allergies: No Known Allergies  Medications  Prior to Admission  Medication Sig Dispense Refill  . amiloride-hydrochlorothiazide (MODURETIC) 5-50 MG tablet Take 1 tablet by mouth daily.      . insulin lispro (HUMALOG) 100 UNIT/ML cartridge Inject 15-30 Units into the skin 3 (three) times daily with meals. Inject 30 units into the skin with breakfast, inject 15 units into the skin with lunch, and inject 30 units into the skin with dinner.    . levothyroxine (SYNTHROID, LEVOTHROID) 150 MCG tablet Take 1 tablet by mouth daily before breakfast.    . losartan (COZAAR) 25 MG tablet Take 1 tablet (25 mg total) by mouth daily. 90 tablet 3  . metoprolol tartrate (LOPRESSOR) 50 MG tablet TAKE ONE TABLET BY MOUTH TWICE A DAY 60 tablet 10  . nitroGLYCERIN (NITROSTAT) 0.4 MG SL tablet Place 0.4 mg under the tongue every 5 (five) minutes as needed for chest pain (MAX of 3 doses).      ROS: History obtained from the patient  General ROS: negative for - chills, fatigue, fever, night sweats, weight gain or weight loss Psychological ROS: negative for - behavioral disorder, hallucinations, memory difficulties, mood swings or suicidal ideation Ophthalmic ROS: negative for - blurry vision, double vision, eye pain or loss of vision ENT ROS: negative for - epistaxis, nasal discharge, oral lesions, sore throat, tinnitus or vertigo Allergy and Immunology ROS: negative for - hives or itchy/watery eyes Hematological and Lymphatic ROS: negative for -  bleeding problems, bruising or swollen lymph nodes Endocrine ROS: negative for - galactorrhea, hair pattern changes, polydipsia/polyuria or temperature intolerance Respiratory ROS: negative for - cough, hemoptysis, shortness of breath or wheezing Cardiovascular ROS: Feeling of chest tightness earlier today which subsequently resolved. Gastrointestinal ROS: negative for - abdominal pain, diarrhea, hematemesis, nausea/vomiting or stool incontinence Genito-Urinary ROS: negative for - dysuria, hematuria, incontinence or  urinary frequency/urgency Musculoskeletal ROS: negative for - joint swelling or muscular weakness Neurological ROS: as noted in HPI Dermatological ROS: negative for rash and skin lesion changes  Physical Examination: Blood pressure (!) 152/49, pulse 64, temperature 97.7 F (36.5 C), temperature source Oral, resp. rate 16, height 5' 3.5" (1.613 m), weight 63.5 kg (140 lb), SpO2 99 %.  HEENT-  Normocephalic, no lesions, without obvious abnormality.  Normal external eye and conjunctiva.  Normal TM's bilaterally.  Normal auditory canals and external ears. Normal external nose, mucus membranes and septum.  Normal pharynx. Neck supple with no masses, nodes, nodules or enlargement. Cardiovascular - regular rate and rhythm, S1, S2 normal, no murmur, click, rub or gallop Lungs - chest clear, no wheezing, rales, normal symmetric air entry Abdomen - soft, non-tender; bowel sounds normal; no masses,  no organomegaly Extremities - no joint deformities, effusion, or inflammation and no edema  Neurologic Examination: Mental Status: Alert, oriented, thought content appropriate.  Speech fluent without evidence of aphasia. Able to follow commands without difficulty. Cranial Nerves: II-Visual fields were normal. III/IV/VI-Pupils were equal and reacted normally to light. Extraocular movements were full and conjugate.    V/VII-no facial numbness and no facial weakness. VIII-normal. X-normal speech and symmetrical palatal movement. Motor: 5/5 bilaterally with normal tone and bulk Sensory: Normal throughout. Deep Tendon Reflexes: 1+ and symmetric in upper extremities and absent in lower extremities. Plantars: Mute bilaterally Cerebellar: Normal coordination of upper extremities. Carotid auscultation: Normal  Results for orders placed or performed during the hospital encounter of 01/06/17 (from the past 48 hour(s))  CBG monitoring, ED     Status: Abnormal   Collection Time: 01/06/17 12:05 PM  Result Value  Ref Range   Glucose-Capillary 199 (H) 65 - 99 mg/dL  Protime-INR     Status: None   Collection Time: 01/06/17 12:07 PM  Result Value Ref Range   Prothrombin Time 12.5 11.4 - 15.2 seconds   INR 0.94   APTT     Status: None   Collection Time: 01/06/17 12:07 PM  Result Value Ref Range   aPTT 25 24 - 36 seconds  CBC     Status: Abnormal   Collection Time: 01/06/17 12:07 PM  Result Value Ref Range   WBC 10.8 (H) 4.0 - 10.5 K/uL   RBC 4.55 3.87 - 5.11 MIL/uL   Hemoglobin 14.1 12.0 - 15.0 g/dL   HCT 41.7 36.0 - 46.0 %   MCV 91.6 78.0 - 100.0 fL   MCH 31.0 26.0 - 34.0 pg   MCHC 33.8 30.0 - 36.0 g/dL   RDW 12.7 11.5 - 15.5 %   Platelets 227 150 - 400 K/uL  Differential     Status: Abnormal   Collection Time: 01/06/17 12:07 PM  Result Value Ref Range   Neutrophils Relative % 71 %   Neutro Abs 7.8 (H) 1.7 - 7.7 K/uL   Lymphocytes Relative 19 %   Lymphs Abs 2.1 0.7 - 4.0 K/uL   Monocytes Relative 7 %   Monocytes Absolute 0.7 0.1 - 1.0 K/uL   Eosinophils Relative 2 %   Eosinophils Absolute 0.2  0.0 - 0.7 K/uL   Basophils Relative 1 %   Basophils Absolute 0.1 0.0 - 0.1 K/uL  Comprehensive metabolic panel     Status: Abnormal   Collection Time: 01/06/17 12:07 PM  Result Value Ref Range   Sodium 136 135 - 145 mmol/L   Potassium 4.9 3.5 - 5.1 mmol/L   Chloride 101 101 - 111 mmol/L   CO2 25 22 - 32 mmol/L   Glucose, Bld 211 (H) 65 - 99 mg/dL   BUN 17 6 - 20 mg/dL   Creatinine, Ser 1.10 (H) 0.44 - 1.00 mg/dL   Calcium 8.6 (L) 8.9 - 10.3 mg/dL   Total Protein 7.4 6.5 - 8.1 g/dL   Albumin 3.7 3.5 - 5.0 g/dL   AST 34 15 - 41 U/L   ALT 23 14 - 54 U/L   Alkaline Phosphatase 104 38 - 126 U/L   Total Bilirubin 0.7 0.3 - 1.2 mg/dL   GFR calc non Af Amer 45 (L) >60 mL/min   GFR calc Af Amer 53 (L) >60 mL/min    Comment: (NOTE) The eGFR has been calculated using the CKD EPI equation. This calculation has not been validated in all clinical situations. eGFR's persistently <60 mL/min signify  possible Chronic Kidney Disease.    Anion gap 10 5 - 15  Troponin I     Status: None   Collection Time: 01/06/17 12:07 PM  Result Value Ref Range   Troponin I <0.03 <0.03 ng/mL  I-Stat Chem 8, ED     Status: Abnormal   Collection Time: 01/06/17 12:16 PM  Result Value Ref Range   Sodium 138 135 - 145 mmol/L   Potassium 4.8 3.5 - 5.1 mmol/L   Chloride 102 101 - 111 mmol/L   BUN 20 6 - 20 mg/dL   Creatinine, Ser 1.10 (H) 0.44 - 1.00 mg/dL   Glucose, Bld 210 (H) 65 - 99 mg/dL   Calcium, Ion 1.13 (L) 1.15 - 1.40 mmol/L   TCO2 27 0 - 100 mmol/L   Hemoglobin 14.3 12.0 - 15.0 g/dL   HCT 42.0 36.0 - 46.0 %   Ct Head Wo Contrast  Result Date: 01/06/2017 CLINICAL DATA:  Awoke this morning with left arm and right facial numbness. No known injury. EXAM: CT HEAD WITHOUT CONTRAST TECHNIQUE: Contiguous axial images were obtained from the base of the skull through the vertex without intravenous contrast. COMPARISON:  Head CT - 04/14/2012; brain MRI - 04/14/2012 FINDINGS: Brain: Grossly unchanged scattered periventricular hypodensities compatible microvascular ischemic disease. The gray-white differentiation is otherwise well maintained without CT evidence of acute large territory infarct. No intraparenchymal or extra-axial mass or hemorrhage. Normal size and configuration of the ventricles and the basilar cisterns. No midline shift. Punctate dystrophic calcification within the right-side of the pons (image 7, series 2) is unchanged. Vascular: Atherosclerotic plaque when the bilateral carotid siphons. Skull: No displaced calvarial fracture. Sinuses/Orbits: Limited visualization the paranasal sinuses and mastoid air cells is normal. No air-fluid levels. A minimal debris is noted within the right external auditory canal. Post bilateral cataract surgery. Other: Regional soft tissues appear normal. IMPRESSION: Microvascular ischemic disease without acute intracranial process. Electronically Signed   By: Sandi Mariscal [name]   On: 01/06/2017 13:24    Assessment: 83 y.o. female with multiple risk factors for stroke presenting with probable right subcortical MCA territory TIA. However, a small vessel subcortical ischemic infarction cannot be ruled out at this point.  Stroke Risk Factors - diabetes mellitus, hyperlipidemia and  hypertension  Plan: 1. HgbA1c, fasting lipid panel 2. MRI, MRA  of the brain without contrast 3. PT consult 4. Echocardiogram 5. Carotid dopplers 6. Prophylactic therapy-Antiplatelet med: Aspirin  7. Risk factor modification 8. Telemetry monitoring  C.R. Nicole Kindred, MD [facility] [phone]  01/06/2017, 7:28 PM

## 2017-01-06 NOTE — ED Notes (Signed)
Pt returned from CT °

## 2017-01-06 NOTE — ED Notes (Signed)
Patient to CT.

## 2017-01-07 ENCOUNTER — Observation Stay (HOSPITAL_BASED_OUTPATIENT_CLINIC_OR_DEPARTMENT_OTHER): Payer: PPO

## 2017-01-07 ENCOUNTER — Encounter (HOSPITAL_COMMUNITY): Payer: Self-pay | Admitting: *Deleted

## 2017-01-07 ENCOUNTER — Observation Stay (HOSPITAL_COMMUNITY): Payer: PPO

## 2017-01-07 DIAGNOSIS — I10 Essential (primary) hypertension: Secondary | ICD-10-CM | POA: Diagnosis not present

## 2017-01-07 DIAGNOSIS — I6521 Occlusion and stenosis of right carotid artery: Secondary | ICD-10-CM | POA: Diagnosis not present

## 2017-01-07 DIAGNOSIS — I639 Cerebral infarction, unspecified: Secondary | ICD-10-CM | POA: Diagnosis not present

## 2017-01-07 DIAGNOSIS — G459 Transient cerebral ischemic attack, unspecified: Secondary | ICD-10-CM | POA: Diagnosis not present

## 2017-01-07 DIAGNOSIS — G458 Other transient cerebral ischemic attacks and related syndromes: Secondary | ICD-10-CM

## 2017-01-07 DIAGNOSIS — E1122 Type 2 diabetes mellitus with diabetic chronic kidney disease: Secondary | ICD-10-CM | POA: Diagnosis not present

## 2017-01-07 DIAGNOSIS — I6602 Occlusion and stenosis of left middle cerebral artery: Secondary | ICD-10-CM | POA: Diagnosis not present

## 2017-01-07 LAB — ECHOCARDIOGRAM COMPLETE
AVLVOTPG: 7 mmHg
Ao-asc: 27 cm
CHL CUP DOP CALC LVOT VTI: 21.2 cm
E/e' ratio: 9
EWDT: 345 ms
FS: 36 % (ref 28–44)
Height: 63.5 in
IVS/LV PW RATIO, ED: 1.33
LADIAMINDEX: 1.47 cm/m2
LASIZE: 25 mm
LAVOLA4C: 19 mL
LEFT ATRIUM END SYS DIAM: 25 mm
LV E/e' medial: 9
LV E/e'average: 9
LV PW d: 11.2 mm — AB (ref 0.6–1.1)
LV TDI E'MEDIAL: 5.63
LV e' LATERAL: 7.28 cm/s
LVOT SV: 54 mL
LVOT area: 2.54 cm2
LVOT diameter: 18 mm
LVOT peak vel: 131 cm/s
MV Dec: 345
MVPKAVEL: 109 m/s
MVPKEVEL: 65.5 m/s
TDI e' lateral: 7.28
Weight: 2240 oz

## 2017-01-07 LAB — CBC
HCT: 38.4 % (ref 36.0–46.0)
HEMOGLOBIN: 12.7 g/dL (ref 12.0–15.0)
MCH: 30 pg (ref 26.0–34.0)
MCHC: 33.1 g/dL (ref 30.0–36.0)
MCV: 90.6 fL (ref 78.0–100.0)
Platelets: 203 10*3/uL (ref 150–400)
RBC: 4.24 MIL/uL (ref 3.87–5.11)
RDW: 12.7 % (ref 11.5–15.5)
WBC: 7.7 10*3/uL (ref 4.0–10.5)

## 2017-01-07 LAB — VAS US CAROTID
LCCAPSYS: 93 cm/s
LEFT ECA DIAS: -8 cm/s
LEFT VERTEBRAL DIAS: 12 cm/s
LICADDIAS: -36 cm/s
LICADSYS: -141 cm/s
LICAPSYS: -81 cm/s
Left CCA dist dias: -15 cm/s
Left CCA dist sys: -80 cm/s
Left CCA prox dias: 17 cm/s
Left ICA prox dias: -20 cm/s
RCCADSYS: -216 cm/s
RIGHT ECA DIAS: -9 cm/s
RIGHT VERTEBRAL DIAS: 8 cm/s
Right CCA prox dias: 14 cm/s
Right CCA prox sys: 89 cm/s

## 2017-01-07 LAB — GLUCOSE, CAPILLARY
Glucose-Capillary: 192 mg/dL — ABNORMAL HIGH (ref 65–99)
Glucose-Capillary: 279 mg/dL — ABNORMAL HIGH (ref 65–99)
Glucose-Capillary: 181 mg/dL — ABNORMAL HIGH (ref 65–99)
Glucose-Capillary: 248 mg/dL — ABNORMAL HIGH (ref 65–99)

## 2017-01-07 LAB — BASIC METABOLIC PANEL
Anion gap: 8 (ref 5–15)
BUN: 12 mg/dL (ref 6–20)
CHLORIDE: 106 mmol/L (ref 101–111)
CO2: 22 mmol/L (ref 22–32)
CREATININE: 1.03 mg/dL — AB (ref 0.44–1.00)
Calcium: 8.6 mg/dL — ABNORMAL LOW (ref 8.9–10.3)
GFR calc Af Amer: 57 mL/min — ABNORMAL LOW (ref >60)
GFR calc non Af Amer: 49 mL/min — ABNORMAL LOW (ref >60)
GLUCOSE: 174 mg/dL — AB (ref 65–99)
Potassium: 3.6 mmol/L (ref 3.5–5.1)
SODIUM: 136 mmol/L (ref 135–145)

## 2017-01-07 LAB — LIPID PANEL
CHOLESTEROL: 216 mg/dL — AB (ref 0–200)
HDL: 30 mg/dL — ABNORMAL LOW (ref >40)
LDL Cholesterol: UNDETERMINED mg/dL (ref 0–99)
TRIGLYCERIDES: 559 mg/dL — AB (ref <150)
Total CHOL/HDL Ratio: 7.2 RATIO
VLDL: UNDETERMINED mg/dL (ref 0–40)

## 2017-01-07 MED ORDER — INSULIN ASPART 100 UNIT/ML ~~LOC~~ SOLN: 0.0000 [IU] | Freq: Three times a day (TID) | SUBCUTANEOUS | Status: DC

## 2017-01-07 MED ORDER — METOPROLOL TARTRATE 50 MG PO TABS
50.0000 mg | ORAL_TABLET | Freq: Two times a day (BID) | ORAL | Status: DC
  Administered 2017-01-07 – 2017-01-08 (×3): 50 mg via ORAL
  Filled 2017-01-07 (×3): qty 1

## 2017-01-07 MED ORDER — ACETAMINOPHEN 325 MG PO TABS
650.0000 mg | ORAL_TABLET | Freq: Four times a day (QID) | ORAL | Status: DC | PRN
  Administered 2017-01-07 – 2017-01-08 (×2): 650 mg via ORAL
  Filled 2017-01-07 (×2): qty 2

## 2017-01-07 MED ORDER — INSULIN ASPART 100 UNIT/ML ~~LOC~~ SOLN
0.0000 [IU] | Freq: Every day | SUBCUTANEOUS | Status: DC
  Administered 2017-01-07: 2 [IU] via SUBCUTANEOUS

## 2017-01-07 MED ORDER — FENOFIBRATE 160 MG PO TABS
160.0000 mg | ORAL_TABLET | Freq: Every day | ORAL | Status: DC
  Administered 2017-01-07 – 2017-01-08 (×2): 160 mg via ORAL
  Filled 2017-01-07 (×2): qty 1

## 2017-01-07 MED ORDER — IOPAMIDOL (ISOVUE-370) INJECTION 76%
INTRAVENOUS | Status: AC
  Filled 2017-01-07: qty 50

## 2017-01-07 MED ORDER — LIVING WELL WITH DIABETES BOOK
Freq: Once | Status: DC
  Filled 2017-01-07: qty 1

## 2017-01-07 NOTE — Consult Note (Addendum)
   [facility] CM Inpatient Consult   01/07/2017  [name] [name] 1932-07-19 [id number]   Referral received and patient has been off the unit for while per staff.  Acknowledgement of referral received from the Diabetic Coordinator.  Will follow for needs.  For questions, please contact:  Natividad Brood, RN BSN [facility] Liaison  [phone] business mobile phone Toll free office [phone]   1515:  Patient had return from test. Patient is a member in the [employer].  Met with the patient at the bedside regarding the referral from the Diabetes Coordinator.  Patient states she would like to have some guidance in eating correctly and meal planning.  Patient would like to some one on one follow up.  Consent form signed and folder with [facility] given with contact information. Of note, [facility] Care management does not replace or interfere with any services arranged or needed for this patient.  For questions, please contact:  Natividad Brood, RN BSN [facility] Liaison  [phone] business mobile phone Toll free office [phone]

## 2017-01-07 NOTE — Progress Notes (Addendum)
Inpatient Diabetes Program Recommendations  AACE/ADA: New Consensus Statement on Inpatient Glycemic Control (2015)  Target Ranges:  Prepandial:   less than 140 mg/dL      Peak postprandial:   less than 180 mg/dL (1-2 hours)      Critically ill patients:  140 - 180 mg/dL  Results for [name] [name], [name] [name] (MRN [id number]) as of 01/07/2017 09:44  Ref. Range 01/06/2017 12:05 01/06/2017 21:38 01/07/2017 06:14  Glucose-Capillary Latest Ref Range: 65 - 99 mg/dL 409199 (H) 811245 (H) 914192 (H)    Review of Glycemic Control  Diabetes history: DM2 Outpatient Diabetes medications: Humalog 30 units with breakfast, Humalog 15 units with lunch, Humalog 30 units with supper Current orders for Inpatient glycemic control: Novolog 0-15 units TID with meals  Inpatient Diabetes Program Recommendations: Insulin - Basal: Patient received one time order for Lantus 20 units last night and fasting glucose is 192 mg/dl this morning. Please consider ordering Lantus 22 units QHS. Insulin-Meal Coverage: Please consider ordering Novolog 4 units TID with meals. Correction (SSI): Please consider ordering Novolog 0-5 units QHS for bedtime correction. HgbA1C: A1C in process.  NOTE: Noted consult for Diabetes Coordinator. Will plan to see patient today. Ordered [facility].   Addendum 01/07/17@10 :35-Spoke with patient about diabetes and home regimen for diabetes control. Patient reports that she is followed by PCP for diabetes management and currently she takes Humalog (insulin pens) 30 units with breakfast, Humalog 15 units with lunch, Humalog 30 units with supper as an outpatient for diabetes control. In talking with patient further, patient states that she takes additional Humalog insulin along with above stated doses based on her glucose. Noted in reviewing chart that patient took Lantus in the past but she has not taken Lantus in over 2 years. Patient states  that she checks her glucose 3-4 times per day. Patient reports that if she eats "like I should" then glucose is usually in the mid 100'[name] mg/dl but when "I don't eat right", it is higher.  Discussed glucose and A1C goals. Discussed importance of checking CBGs and maintaining good CBG control to prevent long-term and short-term complications. Explained how hyperglycemia leads to damage within blood vessels which lead to the common complications seen with uncontrolled diabetes. Stressed to the patient the importance of improving glycemic control to prevent further complications from uncontrolled diabetes. Discussed impact of nutrition, exercise, stress, sickness, and medications on diabetes control. Patient states that she craves sweets and she admits to eating sweets from time to time but she tries to limit sweets.  Discussed carbohydrates, carbohydrate goals per day and meal, along with portion sizes. Patient reports that she knows what she needs to eat for carbohydrate modified diet and she just needs to follow it consistently.  Encouraged patient to continue checking her glucose 3-4 times per day (before meals and at bedtime) and to keep a log book of glucose readings and insulin taken which she needs to take to appointments with PCP. Explained current insulin regimen ordered at this time and informed patient she is got Lantus last night and she may need to begin taking Lantus as an outpatient but if so she would be informed at time of discharge. Patient reports that she is willing to take Lantus as an outpatient if needed. Encouraged patient to make sure to let PCP know if any of her outpatient DM medications are changed. Patient verbalized understanding of information discussed and she states  that she has no further questions at this time related to diabetes.  Thanks, Orlando Penner, RN, MSN, CDE Diabetes Coordinator Inpatient Diabetes Program [phone] (Team Pager from 8am to 5pm)

## 2017-01-07 NOTE — Progress Notes (Signed)
  Echocardiogram 2D Echocardiogram has been performed.  Spyridon Hornstein G Maurisa Tesmer 01/07/2017, 3:06 PM

## 2017-01-07 NOTE — Progress Notes (Signed)
OT Cancellation Note  Patient Details Name: [name] [name] MRN: [id number] DOB: 06/05/1932   Cancelled Treatment:    Reason Eval/Treat Not Completed: Patient at procedure or test/ unavailable. Pt off the floor. OT will check back as schedule allows for evaluation.   Evern BioLaura J Chivon Lepage 01/07/2017, 2:22 PM

## 2017-01-07 NOTE — Discharge Instructions (Signed)
Follow with Primary MD Kari BaarsHawkins, Edward, MD in 7 days   Get CBC, CMP, 2 view Chest X ray checked  by Primary MD or SNF MD in 5-7 days ( we routinely change or add medications that can affect your baseline labs and fluid status, therefore we recommend that you get the mentioned basic workup next visit with your PCP, your PCP may decide not to get them or add new tests based on their clinical decision)  Activity: As tolerated with Full fall precautions use walker/cane & assistance as needed  Disposition Home    Diet:   Diet heart healthy/carb modified.  For Heart failure patients - Check your Weight same time everyday, if you gain over 2 pounds, or you develop in leg swelling, experience more shortness of breath or chest pain, call your Primary MD immediately. Follow Cardiac Low Salt Diet and 1.5 lit/day fluid restriction.  On your next visit with your primary care physician please Get Medicines reviewed and adjusted.  Please request your Prim.MD to go over all Hospital Tests and Procedure/Radiological results at the follow up, please get all Hospital records sent to your Prim MD by signing hospital release before you go home.  If you experience worsening of your admission symptoms, develop shortness of breath, life threatening emergency, suicidal or homicidal thoughts you must seek medical attention immediately by calling 911 or calling your MD immediately  if symptoms less severe.  You Must read complete instructions/literature along with all the possible adverse reactions/side effects for all the Medicines you take and that have been prescribed to you. Take any new Medicines after you have completely understood and accpet all the possible adverse reactions/side effects.   Do not drive, operate heavy machinery, perform activities at heights, swimming or participation in water activities or provide baby sitting services if your were admitted for syncope or siezures until you have seen by Primary MD  or a Neurologist and advised to do so again.  Do not drive when taking Pain medications.    Do not take more than prescribed Pain, Sleep and Anxiety Medications  Special Instructions: If you have smoked or chewed Tobacco  in the last 2 yrs please stop smoking, stop any regular Alcohol  and or any Recreational drug use.  Wear Seat belts while driving.   Please note  You were cared for by a hospitalist during your hospital stay. If you have any questions about your discharge medications or the care you received while you were in the hospital after you are discharged, you can call the unit and asked to speak with the hospitalist on call if the hospitalist that took care of you is not available. Once you are discharged, your primary care physician will handle any further medical issues. Please note that NO REFILLS for any discharge medications will be authorized once you are discharged, as it is imperative that you return to your primary care physician (or establish a relationship with a primary care physician if you do not have one) for your aftercare needs so that they can reassess your need for medications and monitor your lab values.  Diabetes Mellitus and Food It is important for you to manage your blood sugar (glucose) level. Your blood glucose level can be greatly affected by what you eat. Eating healthier foods in the appropriate amounts throughout the day at about the same time each day will help you control your blood glucose level. It can also help slow or prevent worsening of your diabetes mellitus.  Healthy eating may even help you improve the level of your blood pressure and reach or maintain a healthy weight. General recommendations for healthful eating and cooking habits include:  Eating meals and snacks regularly. Avoid going long periods of time without eating to lose weight.  Eating a diet that consists mainly of plant-based foods, such as fruits, vegetables, nuts, legumes, and  whole grains.  Using low-heat cooking methods, such as baking, instead of high-heat cooking methods, such as deep frying.  Work with your dietitian to make sure you understand how to use the Nutrition Facts information on food labels. How can food affect me? Carbohydrates Carbohydrates affect your blood glucose level more than any other type of food. Your dietitian will help you determine how many carbohydrates to eat at each meal and teach you how to count carbohydrates. Counting carbohydrates is important to keep your blood glucose at a healthy level, especially if you are using insulin or taking certain medicines for diabetes mellitus. Alcohol Alcohol can cause sudden decreases in blood glucose (hypoglycemia), especially if you use insulin or take certain medicines for diabetes mellitus. Hypoglycemia can be a life-threatening condition. Symptoms of hypoglycemia (sleepiness, dizziness, and disorientation) are similar to symptoms of having too much alcohol. If your health care provider has given you approval to drink alcohol, do so in moderation and use the following guidelines:  Women should not have more than one drink per day, and men should not have more than two drinks per day. One drink is equal to: ? 12 oz of beer. ? 5 oz of wine. ? 1 oz of hard liquor.  Do not drink on an empty stomach.  Keep yourself hydrated. Have water, diet soda, or unsweetened iced tea.  Regular soda, juice, and other mixers might contain a lot of carbohydrates and should be counted.  What foods are not recommended? As you make food choices, it is important to remember that all foods are not the same. Some foods have fewer nutrients per serving than other foods, even though they might have the same number of calories or carbohydrates. It is difficult to get your body what it needs when you eat foods with fewer nutrients. Examples of foods that you should avoid that are high in calories and carbohydrates but low  in nutrients include:  Trans fats (most processed foods list trans fats on the Nutrition Facts label).  Regular soda.  Juice.  Candy.  Sweets, such as cake, pie, doughnuts, and cookies.  Fried foods.  What foods can I eat? Eat nutrient-rich foods, which will nourish your body and keep you healthy. The food you should eat also will depend on several factors, including:  The calories you need.  The medicines you take.  Your weight.  Your blood glucose level.  Your blood pressure level.  Your cholesterol level.  You should eat a variety of foods, including:  Protein. ? Lean cuts of meat. ? Proteins low in saturated fats, such as fish, egg whites, and beans. Avoid processed meats.  Fruits and vegetables. ? Fruits and vegetables that may help control blood glucose levels, such as apples, mangoes, and yams.  Dairy products. ? Choose fat-free or low-fat dairy products, such as milk, yogurt, and cheese.  Grains, bread, pasta, and rice. ? Choose whole grain products, such as multigrain bread, whole oats, and brown rice. These foods may help control blood pressure.  Fats. ? Foods containing healthful fats, such as nuts, avocado, olive oil, canola oil, and fish.  Does everyone with diabetes mellitus have the same meal plan? Because every person with diabetes mellitus is different, there is not one meal plan that works for everyone. It is very important that you meet with a dietitian who will help you create a meal plan that is just right for you. This information is not intended to replace advice given to you by your health care provider. Make sure you discuss any questions you have with your health care provider. Document Released: 04/23/2005 Document Revised: 01/02/2016 Document Reviewed: 06/23/2013 Elsevier Interactive Patient Education  2017 [employer].

## 2017-01-07 NOTE — Consult Note (Signed)
Hospital Consult    Reason for Consult:  Carotid artery stenosis Requesting Physician:  Thedore Mins MRN #:  [id number]  History of Present Illness: This is a 83 y.o. female who states she woke up at 3am yesterday morning and had numbness from her left elbow down and her left leg from the knee down and periorbital numbness.  She eventually went back to bed after it got somewhat better.  She states that she cooked breakfast and called Dr. Juanetta Gosling.  She told him that she had plans to mow the yard and he told her to get dressed and go straight to the ER [facility]), which she did.  Her symptoms have completely resolved.  She has never had any of these sx before.  She was subsequently transferred to [facility] for further workup.  She did have a carotid duplex that revealed a right carotid artery stenosis of high range 40-59% versus low range 60-79% right internal carotid artery stenosis and 1-39% left internal carotid artery stenosis. Vertebral arteries are patent with antegrade flow.  She states that she does have diabetes.  She was diagnosed with pancreatitis about 5 years ago and has had trouble with diabetes since.  She is on insulin for this.   Her HgA1c is pending.  Since being here, she has been placed on a statin and aspirin.  She is on a beta blocker and ARB for blood pressure management.  She is on synthroid for hypothyroidism.  She has a hx of coronary stent to RCA in '09.    Her chart mentions that she has a hx of PE.  After discussing this with her, she has not had a PE and has never been on anticoagulation.    Her husband is with her and they have been married for 60 years.  Past Medical History:  Diagnosis Date  . CAD (coronary artery disease)    DES tor RCA 2009.  75% ostial first diagonal stenosis, 30% proximal LAD stenosis, 85% proximal right coronary artery stenosis treated with Promus 3.5 x 18 stent.  . Diabetes mellitus   . Hyperlipidemia   . Hypertension   . Palpitations   .  PE (pulmonary embolism)   . Thyroid disease     Past Surgical History:  Procedure Laterality Date  . Abdominal cavity operation     Obstruction  . ABDOMINAL HYSTERECTOMY    . APPENDECTOMY    . CHOLECYSTECTOMY    . LEFT HEART CATHETERIZATION WITH CORONARY ANGIOGRAM N/A 02/20/2013   Procedure: LEFT HEART CATHETERIZATION WITH CORONARY ANGIOGRAM;  Surgeon: Tonny Bollman, MD;  Location: [facility] CATH LAB;  Service: Cardiovascular;  Laterality: N/A;  . TONSILLECTOMY      No Known Allergies  Prior to Admission medications   Medication Sig Start Date End Date Taking? Authorizing Provider  amiloride-hydrochlorothiazide (MODURETIC) 5-50 MG tablet Take 1 tablet by mouth daily.     Yes [provider]  insulin lispro (HUMALOG) 100 UNIT/ML cartridge Inject 15-30 Units into the skin 3 (three) times daily with meals. Inject 30 units into the skin with breakfast, inject 15 units into the skin with lunch, and inject 30 units into the skin with dinner.   Yes [provider]  levothyroxine (SYNTHROID, LEVOTHROID) 150 MCG tablet Take 1 tablet by mouth daily before breakfast. 12/17/16  Yes [provider]  losartan (COZAAR) 25 MG tablet Take 1 tablet (25 mg total) by mouth daily. 11/30/16 02/28/17 Yes Tonny Bollman, MD  metoprolol tartrate (LOPRESSOR) 50 MG tablet TAKE  ONE TABLET BY MOUTH TWICE A DAY 01/05/17  Yes Tonny Bollmanooper, Michael, MD  nitroGLYCERIN (NITROSTAT) 0.4 MG SL tablet Place 0.4 mg under the tongue every 5 (five) minutes as needed for chest pain (MAX of 3 doses).    [provider]    Social History   Social History  . Marital status: Married    Spouse name: N/A  . Number of children: 0  . Years of education: N/A   Occupational History  .  Retired   Social History Main Topics  . Smoking status: Never Smoker  . Smokeless tobacco: Never Used  . Alcohol use No  . Drug use: No  . Sexual activity: No   Other Topics Concern  . Not on file   Social History  Narrative   Lives with husband.       Family History  Problem Relation Age of Onset  . Cancer Mother        lung  . Cancer Father        lung  . Aneurysm Brother   . Coronary artery disease Unknown   . Hypertension Unknown     ROS: [x]  Positive   [ ]  Negative   [ ]  All sytems reviewed and are negative  Cardiac: []  chest pain/pressure []  palpitations []  SOB lying flat []  DOE [x]  hx CAD with DES to RCA '09 [x]  hypertension  Vascular: []  pain in legs while walking []  pain in legs at rest []  pain in legs at night []  non-healing ulcers []  hx of DVT []  swelling in legs  Pulmonary: []  productive cough []  asthma/wheezing []  home O2  Neurologic: [x]  weakness in [x]  left arm [x]  left leg [x]  numbness in [x]  left arm [x]  left leg [x]  hx of CVA []  mini stroke [] difficulty speaking or slurred speech []  temporary loss of vision in one eye []  dizziness  Hematologic: []  hx of cancer []  bleeding problems []  problems with blood clotting easily  Endocrine:   [x]  diabetes [x]  thyroid disease  GI []  vomiting blood []  blood in stool  GU: []  CKD/renal failure []  HD--[]  M/W/F or []  T/T/S []  burning with urination []  blood in urine  Psychiatric: []  anxiety []  depression  Musculoskeletal: []  arthritis []  joint pain  Integumentary: []  rashes []  ulcers  Constitutional: []  fever []  chills   Physical Examination  Vitals:   01/07/17 0849 01/07/17 1452  BP: (!) 139/52   Pulse: 73 (!) 104  Resp: 16   Temp: 98.1 F (36.7 C)    Body mass index is 24.41 kg/m.  General:  WDWN in NAD Gait: Not observed HENT: WNL, normocephalic Pulmonary: normal non-labored breathing, without Rales, rhonchi,  wheezing Cardiac: regular, without  Murmurs, rubs or gallops; without carotid bruits Abdomen:  soft, NT/ND, no masses Skin: without rashes Vascular Exam/Pulses:  Right Left  Radial 2+ (normal) 2+ (normal)  Ulnar Unable to palpate  Unable to palpate   DP 2+  (normal) 2+ (normal)  PT Unable to palpate  Unable to palpate    Extremities: without ischemic changes, without Gangrene , without cellulitis; without open wounds;  Musculoskeletal: no muscle wasting or atrophy  Neurologic: A&O X 3;  No focal weakness or paresthesias are detected; speech is fluent/normal Psychiatric:  The pt has Normal affect.   CBC    Component Value Date/Time   WBC 7.7 01/07/2017 0751   RBC 4.24 01/07/2017 0751   HGB 12.7 01/07/2017 0751   HCT 38.4 01/07/2017 0751   PLT 203 01/07/2017  0751   MCV 90.6 01/07/2017 0751   MCH 30.0 01/07/2017 0751   MCHC 33.1 01/07/2017 0751   RDW 12.7 01/07/2017 0751   LYMPHSABS 2.1 01/06/2017 1207   MONOABS 0.7 01/06/2017 1207   EOSABS 0.2 01/06/2017 1207   BASOSABS 0.1 01/06/2017 1207    BMET    Component Value Date/Time   NA 136 01/07/2017 0751   K 3.6 01/07/2017 0751   CL 106 01/07/2017 0751   CO2 22 01/07/2017 0751   GLUCOSE 174 (H) 01/07/2017 0751   BUN 12 01/07/2017 0751   CREATININE 1.03 (H) 01/07/2017 0751   CALCIUM 8.6 (L) 01/07/2017 0751   GFRNONAA 49 (L) 01/07/2017 0751   GFRAA 57 (L) 01/07/2017 0751    COAGS: Lab Results  Component Value Date   INR 0.94 01/06/2017   INR 0.97 02/20/2013   INR 0.96 04/14/2012     Non-Invasive Vascular Imaging:   Vascular Ultrasound 01/07/17 Carotid Duplex (Doppler) has been completed.   Findings suggest high range 40-59% versus low range 60-79% right internal carotid artery stenosis and 1-39% left internal carotid artery stenosis. Vertebral arteries are patent with antegrade flow.  CT head without contrast 01/06/17: IMPRESSION: Microvascular ischemic disease without acute intracranial process.  MRI head 01/06/17: MRI HEAD IMPRESSION:  1. No acute intracranial infarct or other process identified. 2. Mild for age cerebral atrophy with chronic microvascular ischemic Disease.  Echocardiogram 01/07/17: Study Conclusions  - Left ventricle: The cavity size was  normal. Wall thickness was   increased in a pattern of moderate LVH. Systolic function was   vigorous. The estimated ejection fraction was in the range of 65%   to 70%. Wall motion was normal; there were no regional wall   motion abnormalities. Doppler parameters are consistent with   abnormal left ventricular relaxation (grade 1 diastolic   dysfunction). - Aortic valve: Mildly calcified leaflets. There was no stenosis.   There was no regurgitation. - Mitral valve: Calcified annulus. Mildly thickened leaflets .   There was trivial regurgitation. - Right ventricle: Poorly visualized.  Impressions:  - LVEF 65-70%, moderate LVH, normal wall motion, grade 1 DD with   indeterminate LV filling pressure, mildly calcified aortic valve   wtihout stenosis, trivial MR, the right heart was not   well-visualized   Statin:  Yes.   Beta Blocker:  Yes.   Aspirin:  Yes.   ACEI:  No. ARB:  No. CCB use:  No Other antiplatelets/anticoagulants:  Yes.   Lovenox (DVT prophylaxis)   ASSESSMENT/PLAN: This is a 83 y.o. female with symptomatic right carotid artery stenosis  -pt's sx have resolved.  A CTA of the head/neck has been ordered to better evaluate the carotid artery stenosis.   -continue statin/aspirin -Dr. Randie Heinz to review CTA and will see the pt later today.   Doreatha Massed, PA-C [facility] [phone]   I have independently interviewed patient and agree with PA assessment and plan above. MRI without stroke and echo without source of embolus. CT w/o contrast demonstrates calcium in the siphons and duplex demonstrated elevated velocities distally. For these reasons will get a CT angio of neck and head. She will likely need right carotid intervention pending CTA results.   Brandon C. Randie Heinz, MD [facility] [place] Office: [phone] Pager: [phone]

## 2017-01-07 NOTE — Progress Notes (Signed)
SLP Cancellation Note  Patient Details Name: [name] [name] MRN: [id number] DOB: 07/20/1932   Cancelled treatment:       Reason Eval/Treat Not Completed: Patient at procedure or test/unavailable  [name] [name] B. Murvin NatalBueche, [facility] Dover CampusMSP, CCC-SLP [phone]  Leigh AuroraBueche, Ethyl Vila Brown 01/07/2017, 2:03 PM

## 2017-01-07 NOTE — Progress Notes (Signed)
 @IPLOG         PROGRESS NOTE                                                                                                                                                                                                             Patient Demographics:    [name] [name], is a 83 y.o. female, DOB - 08/02/1932, ZOX:[id number]  Admit date - 01/06/2017   Admitting Physician Filbert SchilderAlexandria U Kadolph, MD  Outpatient Primary MD for the patient is Kari BaarsHawkins, Edward, MD  LOS - 0  Chief Complaint  Patient presents with  . Numbness       Brief Narrative  83 year old female with history of CAD, hypertension, type 2 diabetes mellitus insulin-dependent who was admitted to the hospital with transient left-sided tingling numbness likely due to TIA.   Subjective:    [name] [name] today has, No headache, No chest pain, No abdominal pain - No Nausea, No new weakness tingling or numbness, No Cough - SOB.     Assessment  & Plan :     1.TIA causing left-sided transient pending numbness. CT MRI nonacute, she had high triglycerides with LDL not calculatable, currently on aspirin along with statin and TriCor. Full stroke protocol underway. Neuro on board.  2. Dyslipidemia. Placed on TriCor and statin.  3. CAD. Chest pain free no acute issues, on aspirin, Lopressor, statin, TriCor for secondary prevention thereafter follow with PCP and primary cardiologist.  4. Hypertension. Resume Lopressor, ARB on hold.  5. Hypothyroidism. Continue Synthroid.  6. DM type II. On sliding scale.  Lab Results  Component Value Date   HGBA1C 9.5 (H) 02/18/2013   CBG (last 3)   Recent Labs  01/06/17 2138 01/07/17 0614 01/07/17 1123  GLUCAP 245* 192* 279*      Diet : Diet heart healthy/carb modified Room service appropriate? Yes; Fluid consistency: Thin    Family Communication  :  None  Code Status :  Full  Disposition Plan  :  Home  Consults  :  Neuro  Procedures  :    MRI-CT - non  acute  Echocardiogram  Carotid duplex  DVT Prophylaxis  :  Lovenox    Lab Results  Component Value Date   PLT 203 01/07/2017    Inpatient Medications  Scheduled Meds: . aspirin  325 mg Oral Daily  . enoxaparin (LOVENOX) injection  40 mg Subcutaneous Q24H  . insulin aspart  0-15 Units Subcutaneous TID WC  . levothyroxine  150 mcg Oral QAC breakfast  . living well with  diabetes book   Does not apply Once  . rosuvastatin  40 mg Oral q1800   Continuous Infusions: PRN Meds:.acetaminophen, hydrALAZINE  Antibiotics  :    Anti-infectives    None         Objective:   Vitals:   01/07/17 0604 01/07/17 0605 01/07/17 0610 01/07/17 0849  BP:    (!) 139/52  Pulse:   74 73  Resp:  14 14 16   Temp:    98.1 F (36.7 C)  TempSrc:    Oral  SpO2: 92% (!) 89% 97% 98%  Weight:      Height:        Wt Readings from Last 3 Encounters:  01/06/17 63.5 kg (140 lb)  11/30/16 64.1 kg (141 lb 6.4 oz)  11/11/15 70.7 kg (155 lb 12.8 oz)     Intake/Output Summary (Last 24 hours) at 01/07/17 1236 Last data filed at 01/07/17 0849  Gross per 24 hour  Intake              240 ml  Output                0 ml  Net              240 ml     Physical Exam  Awake Alert, Oriented X 3, No new F.N deficits, Normal affect [place].AT,PERRAL Supple Neck,No JVD, No cervical lymphadenopathy appriciated.  Symmetrical Chest wall movement, Good air movement bilaterally, CTAB RRR,No Gallops,Rubs or new Murmurs, No Parasternal Heave +ve B.Sounds, Abd Soft, No tenderness, No organomegaly appriciated, No rebound - guarding or rigidity. No Cyanosis, Clubbing or edema, No new Rash or bruise       Data Review:    CBC  Recent Labs Lab 01/06/17 1207 01/06/17 1216 01/07/17 0751  WBC 10.8*  --  7.7  HGB 14.1 14.3 12.7  HCT 41.7 42.0 38.4  PLT 227  --  203  MCV 91.6  --  90.6  MCH 31.0  --  30.0  MCHC 33.8  --  33.1  RDW 12.7  --  12.7  LYMPHSABS 2.1  --   --   MONOABS 0.7  --   --   EOSABS 0.2   --   --   BASOSABS 0.1  --   --     Chemistries   Recent Labs Lab 01/06/17 1207 01/06/17 1216 01/07/17 0751  NA 136 138 136  K 4.9 4.8 3.6  CL 101 102 106  CO2 25  --  22  GLUCOSE 211* 210* 174*  BUN 17 20 12   CREATININE 1.10* 1.10* 1.03*  CALCIUM 8.6*  --  8.6*  AST 34  --   --   ALT 23  --   --   ALKPHOS 104  --   --   BILITOT 0.7  --   --    ------------------------------------------------------------------------------------------------------------------  Recent Labs  01/07/17 0751  CHOL 216*  HDL 30*  LDLCALC UNABLE TO CALCULATE IF TRIGLYCERIDE OVER 400 mg/dL  TRIG 161*  CHOLHDL 7.2    Lab Results  Component Value Date   HGBA1C 9.5 (H) 02/18/2013   ------------------------------------------------------------------------------------------------------------------ No results for input(s): TSH, T4TOTAL, T3FREE, THYROIDAB in the last 72 hours.  Invalid input(s): FREET3 ------------------------------------------------------------------------------------------------------------------ No results for input(s): VITAMINB12, FOLATE, FERRITIN, TIBC, IRON, RETICCTPCT in the last 72 hours.  Coagulation profile  Recent Labs Lab 01/06/17 1207  INR 0.94    No results for input(s): DDIMER in the last 72 hours.  Cardiac Enzymes  Recent Labs Lab 01/06/17 1207  TROPONINI <0.03   ------------------------------------------------------------------------------------------------------------------ No results found for: BNP  Micro Results No results found for this or any previous visit (from the past 240 hour(s)).  Radiology Reports Ct Head Wo Contrast  Result Date: 01/06/2017 CLINICAL DATA:  Awoke this morning with left arm and right facial numbness. No known injury. EXAM: CT HEAD WITHOUT CONTRAST TECHNIQUE: Contiguous axial images were obtained from the base of the skull through the vertex without intravenous contrast. COMPARISON:  Head CT - 04/14/2012; brain MRI -  04/14/2012 FINDINGS: Brain: Grossly unchanged scattered periventricular hypodensities compatible microvascular ischemic disease. The gray-white differentiation is otherwise well maintained without CT evidence of acute large territory infarct. No intraparenchymal or extra-axial mass or hemorrhage. Normal size and configuration of the ventricles and the basilar cisterns. No midline shift. Punctate dystrophic calcification within the right-side of the pons (image 7, series 2) is unchanged. Vascular: Atherosclerotic plaque when the bilateral carotid siphons. Skull: No displaced calvarial fracture. Sinuses/Orbits: Limited visualization the paranasal sinuses and mastoid air cells is normal. No air-fluid levels. A minimal debris is noted within the right external auditory canal. Post bilateral cataract surgery. Other: Regional soft tissues appear normal. IMPRESSION: Microvascular ischemic disease without acute intracranial process. Electronically Signed   By: Simonne Come [name]   On: 01/06/2017 13:24   Mr Brain Wo Contrast  Result Date: 01/06/2017 CLINICAL DATA:  Initial evaluation for acute left arm numbness with right facial numbness. EXAM: MRI HEAD WITHOUT CONTRAST MRA HEAD WITHOUT CONTRAST TECHNIQUE: Multiplanar, multiecho pulse sequences of the brain and surrounding structures were obtained without intravenous contrast. Angiographic images of the head were obtained using MRA technique without contrast. COMPARISON:  Prior CT from earlier the same day. FINDINGS: MRI HEAD FINDINGS Brain: Mild age-related cerebral atrophy. Patchy and confluent T2/FLAIR hyperintensity involving the periventricular and deep white matter both cerebral hemispheres most like related chronic small vessel ischemic disease, extremely mild for age. No abnormal foci of restricted diffusion to suggest acute or subacute ischemia. Gray-white matter differentiation well maintained. No encephalomalacia to suggest chronic infarction. No evidence for  acute intracranial hemorrhage. Single subcentimeter focus of susceptibility artifact noted within and left pons, of doubtful significance in isolation. No other evidence for chronic hemorrhage. No mass lesion, midline shift or mass effect. No hydrocephalus. No extra-axial fluid collection. Major dural sinuses are grossly patent. Pituitary gland suprasellar region within normal limits. Midline structures intact and normal. Vascular: Major intracranial vascular flow voids are maintained. Skull and upper cervical spine: Craniocervical junction within normal limits. Bone marrow signal intensity within normal limits. No scalp soft tissue abnormality. Sinuses/Orbits: Globes and orbital soft tissues demonstrate no acute abnormality. Patient status post lens extraction bilaterally. Flattening of the posterior aspect of the left globe noted. Paranasal sinuses are clear. No mastoid effusion. Inner ear structures normal. Other: None. MRA HEAD FINDINGS ANTERIOR CIRCULATION: Distal cervical segments of the internal carotid arteries are widely patent with antegrade flow. Petrous, cavernous, and supraclinoid segments patent without stenosis. ICA termini widely patent. A1 segments, anterior communicating artery common anterior cerebral arteries widely patent. M1 segments patent without stenosis or occlusion. No proximal M2 occlusion. Distal MCA branches well opacified and symmetric. POSTERIOR CIRCULATION: Vertebral artery is diminutive bilaterally, with the left vertebral artery slightly dominant. Left vertebral artery patent to the vertebrobasilar junction without stenosis. Right vertebral artery largely terminates in PICA. Posterior inferior cerebral arteries are patent proximally. Basilar artery diminutive but patent to its distal aspect. Superior cerebral arteries patent  bilaterally. Fetal type PCAs supplied via widely patent posterior communicating arteries. PCAs are patent to their distal aspects without stenosis. No aneurysm  or vascular malformation. IMPRESSION: MRI HEAD IMPRESSION: 1. No acute intracranial infarct or other process identified. 2. Mild for age cerebral atrophy with chronic microvascular ischemic disease. MRA HEAD IMPRESSION: Normal intracranial MRA. Electronically Signed   By: Rise Mu [name]   On: 01/06/2017 21:15   Mr Maxine Glenn Headm  Result Date: 01/06/2017 CLINICAL DATA:  Initial evaluation for acute left arm numbness with right facial numbness. EXAM: MRI HEAD WITHOUT CONTRAST MRA HEAD WITHOUT CONTRAST TECHNIQUE: Multiplanar, multiecho pulse sequences of the brain and surrounding structures were obtained without intravenous contrast. Angiographic images of the head were obtained using MRA technique without contrast. COMPARISON:  Prior CT from earlier the same day. FINDINGS: MRI HEAD FINDINGS Brain: Mild age-related cerebral atrophy. Patchy and confluent T2/FLAIR hyperintensity involving the periventricular and deep white matter both cerebral hemispheres most like related chronic small vessel ischemic disease, extremely mild for age. No abnormal foci of restricted diffusion to suggest acute or subacute ischemia. Gray-white matter differentiation well maintained. No encephalomalacia to suggest chronic infarction. No evidence for acute intracranial hemorrhage. Single subcentimeter focus of susceptibility artifact noted within and left pons, of doubtful significance in isolation. No other evidence for chronic hemorrhage. No mass lesion, midline shift or mass effect. No hydrocephalus. No extra-axial fluid collection. Major dural sinuses are grossly patent. Pituitary gland suprasellar region within normal limits. Midline structures intact and normal. Vascular: Major intracranial vascular flow voids are maintained. Skull and upper cervical spine: Craniocervical junction within normal limits. Bone marrow signal intensity within normal limits. No scalp soft tissue abnormality. Sinuses/Orbits: Globes and orbital soft  tissues demonstrate no acute abnormality. Patient status post lens extraction bilaterally. Flattening of the posterior aspect of the left globe noted. Paranasal sinuses are clear. No mastoid effusion. Inner ear structures normal. Other: None. MRA HEAD FINDINGS ANTERIOR CIRCULATION: Distal cervical segments of the internal carotid arteries are widely patent with antegrade flow. Petrous, cavernous, and supraclinoid segments patent without stenosis. ICA termini widely patent. A1 segments, anterior communicating artery common anterior cerebral arteries widely patent. M1 segments patent without stenosis or occlusion. No proximal M2 occlusion. Distal MCA branches well opacified and symmetric. POSTERIOR CIRCULATION: Vertebral artery is diminutive bilaterally, with the left vertebral artery slightly dominant. Left vertebral artery patent to the vertebrobasilar junction without stenosis. Right vertebral artery largely terminates in PICA. Posterior inferior cerebral arteries are patent proximally. Basilar artery diminutive but patent to its distal aspect. Superior cerebral arteries patent bilaterally. Fetal type PCAs supplied via widely patent posterior communicating arteries. PCAs are patent to their distal aspects without stenosis. No aneurysm or vascular malformation. IMPRESSION: MRI HEAD IMPRESSION: 1. No acute intracranial infarct or other process identified. 2. Mild for age cerebral atrophy with chronic microvascular ischemic disease. MRA HEAD IMPRESSION: Normal intracranial MRA. Electronically Signed   By: Rise Mu [name]   On: 01/06/2017 21:15    Time Spent in minutes  30   Susa Raring M.D on 01/07/2017 at 12:36 PM  Between 7am to 7pm - Pager - [phone] ( page via [email or web address], text pages only, please mention full 10 digit call back number). After 7pm go to [email or web address] - password [email or web address]

## 2017-01-07 NOTE — Care Management Obs Status (Signed)
MEDICARE OBSERVATION STATUS NOTIFICATION   Patient Details  Name: [name] [name] MRN: [id number] Date of Birth: 05/24/1932   Medicare Observation Status Notification Given:  Yes    [name], RN 01/07/2017, 12:44 PM

## 2017-01-07 NOTE — Care Management Note (Signed)
Case Management Note  Patient Details  Name: Nehemiah Massedlma S Hosack MRN: [id number] Date of Birth: 12/01/1931  Subjective/Objective:    Pt in with TIA. She is from home with spouse.                 Action/Plan: No f/u or DME per PT. Awaiting OT recommendations. CM following for d/c needs, physician orders.  Expected Discharge Date:  01/08/17               Expected Discharge Plan:  Home/Self Care  In-House Referral:     Discharge planning Services     Post Acute Care Choice:    Choice offered to:     DME Arranged:    DME Agency:     HH Arranged:    HH Agency:     Status of Service:  In process, will continue to follow  If discussed at Long Length of Stay Meetings, dates discussed:    Additional Comments:  Kermit BaloKelli F Brenson Hartman, RN 01/07/2017, 11:32 AM

## 2017-01-07 NOTE — Evaluation (Signed)
Occupational Therapy Evaluation and Discharge Patient Details Name: BRYTNEY SOMES MRN: [id number] DOB: [birth year] Today's Date: 01/07/2017    History of Present Illness Pt is an 83 y/o female admitted secondary to L sided numbness. MRI and CT both negative for any acute findings. PMH including but not limited to CAD, DM, HLD and HTN.   Clinical Impression   PTA Pt independent in ADL/IADL and mobility. Pt has returned to baseline. Pt able to complete ADL at independent level with sequencing as requested by OT. Pt to return home with husband, and has no further OT needs at this time. OT to sign off. Thank you for this referral.    Follow Up Recommendations  No OT follow up    Equipment Recommendations  None recommended by OT    Recommendations for Other Services       Precautions / Restrictions Precautions Precautions: None Restrictions Weight Bearing Restrictions: No      Mobility Bed Mobility               General bed mobility comments: pt sitting OOB in recliner chair when therapist arrived  Transfers Overall transfer level: Needs assistance Equipment used: None Transfers: Sit to/from Stand Sit to Stand: Supervision         General transfer comment: increased time, supervision for safety    Balance Overall balance assessment: Needs assistance Sitting-balance support: Feet supported Sitting balance-Leahy Scale: Normal     Standing balance support: During functional activity;No upper extremity supported Standing balance-Leahy Scale: Good                             ADL either performed or assessed with clinical judgement   ADL Overall ADL's : Independent                                       General ADL Comments: Pt able to don/doff socks, ambulate and perform sink level grooming, follow directions during MMT.      Vision Baseline Vision/History: Wears glasses Wears Glasses: At all times Patient Visual Report: No change  from baseline Vision Assessment?: No apparent visual deficits     Perception     Praxis      Pertinent Vitals/Pain Pain Assessment: No/denies pain     Hand Dominance Right   Extremity/Trunk Assessment Upper Extremity Assessment Upper Extremity Assessment: Overall WFL for tasks assessed   Lower Extremity Assessment Lower Extremity Assessment: Overall WFL for tasks assessed   Cervical / Trunk Assessment Cervical / Trunk Assessment: Kyphotic   Communication Communication Communication: No difficulties   Cognition Arousal/Alertness: Awake/alert Behavior During Therapy: WFL for tasks assessed/performed Overall Cognitive Status: Within Functional Limits for tasks assessed                                     General Comments  Pt's husband present in room during session    Exercises     Shoulder Instructions      Home Living Family/patient expects to be discharged to:: Private residence Living Arrangements: Spouse/significant other Available Help at Discharge: Family;Available 24 hours/day Type of Home: House Home Access: Level entry     Home Layout: One level     Bathroom Shower/Tub: Tub/shower unit;Walk-in shower   Bathroom Toilet: Standard  Home Equipment: None          Prior Functioning/Environment Level of Independence: Independent                 OT Problem List: Impaired UE functional use      OT Treatment/Interventions:      OT Goals(Current goals can be found in the care plan section) Acute Rehab OT Goals Patient Stated Goal: return home today OT Goal Formulation: With patient Time For Goal Achievement: 01/14/17 Potential to Achieve Goals: Good  OT Frequency:     Barriers to D/C:            Co-evaluation              AM-PAC PT "6 Clicks" Daily Activity     Outcome Measure Help from another person eating meals?: None Help from another person taking care of personal grooming?: None Help from another  person toileting, which includes using toliet, bedpan, or urinal?: None Help from another person bathing (including washing, rinsing, drying)?: None Help from another person to put on and taking off regular upper body clothing?: None Help from another person to put on and taking off regular lower body clothing?: None 6 Click Score: 24   End of Session Equipment Utilized During Treatment: Gait belt Nurse Communication: Mobility status  Activity Tolerance: Patient tolerated treatment well Patient left: in chair;with family/visitor present;with call bell/phone within reach  OT Visit Diagnosis: Other symptoms and signs involving the nervous system (R29.898)                Time: 1640-1650 OT Time Calculation (min): 10 min Charges:  OT General Charges $OT Visit: 1 Procedure OT Evaluation $OT Eval Low Complexity: 1 Procedure G-Codes: OT G-codes **NOT FOR INPATIENT CLASS** Functional Assessment Tool Used: AM-PAC 6 Clicks Daily Activity Functional Limitation: Self care Self Care Current Status (Z6109(G8987): 0 percent impaired, limited or restricted Self Care Goal Status (U0454(G8988): 0 percent impaired, limited or restricted Self Care Discharge Status (U9811(G8989): 0 percent impaired, limited or restricted   Sherryl MangesLaura Luccia Reinheimer OTR/L [phone] Evern BioLaura J Lipa Knauff 01/07/2017, 5:17 PM

## 2017-01-07 NOTE — Progress Notes (Signed)
Patient c/o 8/10 frontal h/a, unrelieved by decreased lighting, food and warm compress to forehead. MD paged. 650 mg Tylenol PO ordered per Dr. Toniann FailKakrakandy.

## 2017-01-07 NOTE — Progress Notes (Signed)
Patient had consultation with DM coordinator today. Patient has no HS SSI or schedule HS insulin. Patient CBG ranging 180-220's. TRH paged. RN will continue to monitor.

## 2017-01-07 NOTE — Progress Notes (Signed)
*  PRELIMINARY RESULTS* Vascular Ultrasound Carotid Duplex (Doppler) has been completed.   Findings suggest high range 40-59% versus low range 60-79% right internal carotid artery stenosis and 1-39% left internal carotid artery stenosis. Vertebral arteries are patent with antegrade flow.  01/07/2017 1:05 PM Gertie FeyMichelle Thatcher Doberstein, BS, RVT, RDCS, RDMS

## 2017-01-07 NOTE — Plan of Care (Signed)
Problem: Education: Goal: Knowledge of patient specific risk factors addressed and post discharge goals established will improve Outcome: Progressing Patient v/u difficulty managing DM. Patient states "I've never been able to control my diet." RN provided education on long-term effects uncontrolled DM has on body systems and encouraged patient to make changes to diet, activity and monitoring of blood glucose. DM coordinator consult entered. Patient v/u and states she feels she would benefit from further education/consultation with DM coordinator.   Problem: Coping: Goal: Ability to identify strategies to decrease anxiety will improve Outcome: Progressing Patient verbalized concern for husband during hospitalization. Patient states husband doesn't know what to do without her and states he worries so much.  Problem: Nutrition: Goal: Risk of aspiration will decrease Outcome: Completed/Met Date Met: 01/07/17 Patient passed stroke swallow screen and placed on a heart healthy, carb modified diet. RN will continue to monitor.

## 2017-01-07 NOTE — Evaluation (Signed)
Physical Therapy Evaluation Patient Details Name: [name] [name] MRN: [id number] DOB: 03/05/1932 Today's Date: 01/07/2017   History of Present Illness  Pt is an 83 y/o female admitted secondary to L sided numbness. MRI and CT both negative for any acute findings. PMH including but not limited to CAD, DM, HLD and HTN.  Clinical Impression  Pt presented sitting OOB in recliner chair, awake and willing to participate in therapy session. Prior to admission, pt reported that she was independent with all functional mobility and ADLs. Pt lives with her husband who is available to assist 24/7 if needed. Pt ambulated in hallway with supervision with no LOB. Pt with no reports of pain or any symptoms throughout. No further acute PT needs identified at this time. PT signing off.     Follow Up Recommendations No PT follow up    Equipment Recommendations  None recommended by PT    Recommendations for Other Services       Precautions / Restrictions Precautions Precautions: None Restrictions Weight Bearing Restrictions: No      Mobility  Bed Mobility               General bed mobility comments: pt sitting OOB in recliner chair when therapist arrived  Transfers Overall transfer level: Needs assistance Equipment used: None Transfers: Sit to/from Stand Sit to Stand: Supervision         General transfer comment: increased time, supervision for safety  Ambulation/Gait Ambulation/Gait assistance: Supervision Ambulation Distance (Feet): 250 Feet Assistive device: None Gait Pattern/deviations: Step-through pattern;Decreased stride length Gait velocity: decreased Gait velocity interpretation: Below normal speed for age/gender General Gait Details: slow, steady gait, no LOB or need for physical assistance, supervision for safety  Stairs            Wheelchair Mobility    Modified Rankin (Stroke Patients Only)       Balance Overall balance assessment: Needs  assistance Sitting-balance support: Feet supported Sitting balance-Leahy Scale: Good     Standing balance support: During functional activity;No upper extremity supported Standing balance-Leahy Scale: Fair                               Pertinent Vitals/Pain Pain Assessment: No/denies pain    Home Living Family/patient expects to be discharged to:: Private residence Living Arrangements: Spouse/significant other Available Help at Discharge: Family;Available 24 hours/day Type of Home: House Home Access: Level entry     Home Layout: One level Home Equipment: None      Prior Function Level of Independence: Independent               Hand Dominance   Dominant Hand: Right    Extremity/Trunk Assessment   Upper Extremity Assessment Upper Extremity Assessment: Overall WFL for tasks assessed    Lower Extremity Assessment Lower Extremity Assessment: Overall WFL for tasks assessed    Cervical / Trunk Assessment Cervical / Trunk Assessment: Kyphotic  Communication   Communication: No difficulties  Cognition Arousal/Alertness: Awake/alert Behavior During Therapy: WFL for tasks assessed/performed Overall Cognitive Status: Within Functional Limits for tasks assessed                                        General Comments      Exercises     Assessment/Plan    PT Assessment Patent does not need any further  PT services  PT Problem List         PT Treatment Interventions      PT Goals (Current goals can be found in the Care Plan section)  Acute Rehab PT Goals Patient Stated Goal: return home today    Frequency     Barriers to discharge        Co-evaluation               AM-PAC PT "6 Clicks" Daily Activity  Outcome Measure Difficulty turning over in bed (including adjusting bedclothes, sheets and blankets)?: None Difficulty moving from lying on back to sitting on the side of the bed? : None Difficulty sitting down on  and standing up from a chair with arms (e.g., wheelchair, bedside commode, etc,.)?: None Help needed moving to and from a bed to chair (including a wheelchair)?: None Help needed walking in hospital room?: None Help needed climbing 3-5 steps with a railing? : A Little 6 Click Score: 23    End of Session   Activity Tolerance: Patient tolerated treatment well Patient left: in chair;with call bell/phone within reach Nurse Communication: Mobility status PT Visit Diagnosis: Other symptoms and signs involving the nervous system (R29.898)    Time: 1610-9604 PT Time Calculation (min) (ACUTE ONLY): 13 min   Charges:   PT Evaluation $PT Eval Low Complexity: 1 Procedure     PT G Codes:   PT G-Codes **NOT FOR INPATIENT CLASS** Functional Assessment Tool Used: AM-PAC 6 Clicks Basic Mobility;Clinical judgement Functional Limitation: Mobility: Walking and moving around Mobility: Walking and Moving Around Current Status (V4098): At least 1 percent but less than 20 percent impaired, limited or restricted Mobility: Walking and Moving Around Goal Status [phone]): 0 percent impaired, limited or restricted Mobility: Walking and Moving Around Discharge Status [phone]): At least 1 percent but less than 20 percent impaired, limited or restricted    [facility], [place], DPT [phone]   Alessandra Bevels Annetta Deiss 01/07/2017, 9:30 AM

## 2017-01-07 NOTE — Progress Notes (Signed)
STROKE TEAM PROGRESS NOTE   SUBJECTIVE (INTERVAL HISTORY) She presented with transient left body numbness and weakness which appears to have improved. MRI scan shows no definite acute infarct. She has no new complaints today. She is accompanied by her nephew and husband at the bedside   OBJECTIVE Temp:  [97.7 F (36.5 C)-98.2 F (36.8 C)] 98.1 F (36.7 C) (05/31 0849) Pulse Rate:  [46-78] 73 (05/31 0849) Cardiac Rhythm: Normal sinus rhythm;Heart block (05/31 0702) Resp:  [8-16] 16 (05/31 0849) BP: (121-175)/(44-92) 139/52 (05/31 0849) SpO2:  [88 %-100 %] 98 % (05/31 0849)  CBC:   Recent Labs Lab 01/06/17 1207 01/06/17 1216 01/07/17 0751  WBC 10.8*  --  7.7  NEUTROABS 7.8*  --   --   HGB 14.1 14.3 12.7  HCT 41.7 42.0 38.4  MCV 91.6  --  90.6  PLT 227  --  203    Basic Metabolic Panel:   Recent Labs Lab 01/06/17 1207 01/06/17 1216 01/07/17 0751  NA 136 138 136  K 4.9 4.8 3.6  CL 101 102 106  CO2 25  --  22  GLUCOSE 211* 210* 174*  BUN 17 20 12   CREATININE 1.10* 1.10* 1.03*  CALCIUM 8.6*  --  8.6*    PHYSICAL EXAM Pleasant frail elderly Caucasian lady not in distress. . Afebrile. Head is nontraumatic. Neck is supple without bruit.    Cardiac exam no murmur or gallop. Lungs are clear to auscultation. Distal pulses are well felt. Neurological Exam ;  Awake  Alert oriented x 3. Normal speech and language.eye movements full without nystagmus.fundi were not visualized. Vision acuity and fields appear normal. Hearing is normal. Palatal movements are normal. Face symmetric. Tongue midline. Normal strength, tone, reflexes and coordination. Normal sensation. Gait deferred.  ASSESSMENT/PLAN Ms. Nehemiah Massedlma S Lofton is a 83 y.o. female with history of CAD, HTN, DM presenting with L sided tingling and numbness. She did not receive IV t-PA .   Right brain TIA likely small vessel disease  CT Small vessel disease. No acute process.  MRI  No acute infarct. Small vessel disease.  Atrophy.   MRA  Unremarkable   Carotid Doppler  R ICA 40-59% vs 60-79%, L 1-39%. No VA stenosis. 2D Echo  Left ventricle: The cavity size was normal. Wall thickness was   increased in a pattern of moderate LVH. Systolic function was   vigorous. The estimated ejection fraction was in the range of 65%   to 70%. Wall motion was normal; there were no regional wall    motion abnormalities.  LDL UTC  TG 559  TC 216  HgbA1c pending  Lovenox 40 mg sq daily for VTE prophylaxis Diet heart healthy/carb modified Room service appropriate? Yes; Fluid consistency: Thin  No antithrombotic prior to admission, now on aspirin 325 mg daily  Therapy recommendations:  No PT  Disposition:  pending   Essential Hypertension  Stable  Permissive hypertension (OK if < 220/120) but gradually normalize in 5-7 days  Long-term BP goal normotensive  Hyperlipidemia  Home meds:  No statin  LDL UTC  Now on crestor 40 mg  Continue statin at discharge  Diabetes type II  HgbA1c pending, goal < 7.0  Uncontrolled  DB RN has reviewed and made recommendations   Other Stroke Risk Factors  Advanced age  UDS / ETOH level not performed   Coronary artery disease w/ DES  Hx PE  Other Active Problems  hypothyroidism  Hospital day # 0  I have personally  examined this patient, reviewed notes, independently viewed imaging studies, participated in medical decision making and plan of care.ROS completed by me personally and pertinent positives fully documented  I have made any additions or clarifications directly to the above note. She presented with transient left body numbness secondary to right brain TIA from small vessel disease. Recommend aspirin 81 mg daily for stroke prevention and continue ongoing stroke workup and aggressive risk factor modification. Long discussion of the bedside with the patient, husband and nephew and answered questions. Discussed with Dr. Thedore Mins. Greater than 50% time during  this 35 minute visit was spent on counseling and coordination of care about the TIA, stroke risk factor modification, treatment discussion and answering questions  Delia Heady, MD Medical Director [facility] Pager: [phone] 01/07/2017 4:27 PM   To contact Stroke Continuity provider, please refer to [email or web address]. After hours, contact General Neurology

## 2017-01-08 ENCOUNTER — Telehealth: Payer: Self-pay | Admitting: *Deleted

## 2017-01-08 ENCOUNTER — Telehealth: Payer: Self-pay | Admitting: Vascular Surgery

## 2017-01-08 DIAGNOSIS — I10 Essential (primary) hypertension: Secondary | ICD-10-CM | POA: Diagnosis not present

## 2017-01-08 DIAGNOSIS — G458 Other transient cerebral ischemic attacks and related syndromes: Secondary | ICD-10-CM | POA: Diagnosis not present

## 2017-01-08 DIAGNOSIS — E1122 Type 2 diabetes mellitus with diabetic chronic kidney disease: Secondary | ICD-10-CM

## 2017-01-08 LAB — GLUCOSE, CAPILLARY
Glucose-Capillary: 228 mg/dL — ABNORMAL HIGH (ref 65–99)
Glucose-Capillary: 339 mg/dL — ABNORMAL HIGH (ref 65–99)

## 2017-01-08 LAB — HEMOGLOBIN A1C
Hgb A1c MFr Bld: 12.7 % — ABNORMAL HIGH (ref 4.8–5.6)
Mean Plasma Glucose: 318 mg/dL

## 2017-01-08 MED ORDER — FENOFIBRATE 160 MG PO TABS: 160.0000 mg | ORAL_TABLET | Freq: Every day | ORAL | 0 refills | Status: AC

## 2017-01-08 MED ORDER — ASPIRIN 325 MG PO TABS: 325.0000 mg | ORAL_TABLET | Freq: Every day | ORAL | 0 refills | Status: AC

## 2017-01-08 MED ORDER — ROSUVASTATIN CALCIUM 40 MG PO TABS: 40.0000 mg | ORAL_TABLET | Freq: Every day | ORAL | 0 refills | Status: AC

## 2017-01-08 NOTE — Patient Outreach (Signed)
[facility]  [facility]) Care Management  01/08/2017  [name] [name] 11/23/1931 [id number]  Care Coordination   [facility] CM received a referral from hospital liaison, Mike GipV Brewer for Diabetes care management, patient prefers some one on one education and planning needs and community care manager for post hospital follow up for home visit. PMH Other specified transient cerebral ischemias, Controlled type 2 diabetes mellitus with chronic kidney disease, without long-term current use of insulin, unspecified CKD stage [facility](HCC) Department:    HT[facility] member:  [facility] called and left a voice message including [facility] CM's mobile number for a return call   [facility] CM will make further call attempt to engage with [name] [name] on next week Routed noted to pcp E Aetnahawkins  [name] Lemon L. Noelle PennerGibbs, RN, BSN, CCM [facility] Care Management [phone]

## 2017-01-08 NOTE — Telephone Encounter (Signed)
Sched appt 07/16/17; lab at 2:00 and MD at 2:45. Mailed appt letter.

## 2017-01-08 NOTE — Progress Notes (Signed)
Patient is discharged from room 5M20 at this time. Alert and in stable condition. IV site d/c'd as well as tele. Instructions read to patient with understanding verbalized. Left unit via wheelchair with all belongings and family at side.

## 2017-01-08 NOTE — Progress Notes (Signed)
Patient ID: [name] [name], female   DOB: 05/30/1932, 83 y.o.   MRN: [id number]  Request to see and evaluate for possible treatment this pt with L MCA stenosis  CTA yesterday: IMPRESSION: 1. Negative for emergent large vessel occlusion. 2. Positive for a severe stenosis at the origin of the left MCA middle M2 branch, with preserved distal patency. 3. Otherwise mild for age atherosclerosis. No other hemodynamically significant stenosis identified in the head or neck. 4.  Stable CT appearance of the brain.   Dr Corliss Skainseveshwar has seen and spoken to pt regarding cerebral arteriogram with possible angioplasty/stent placement.  Pt is agreeable to see Dr Corliss Skainseveshwar in consultation next week. She will hear from scheduler for time and date of consultation She is encouraged to bring family member with her. She is agreeable to plan  She has Dr Corliss Skainseveshwar number and scheduler number

## 2017-01-08 NOTE — Progress Notes (Signed)
   Attempted to see patient this morning but she is off of floor. CTA did not demonstrate any significant carotid artery stenoses on the right to account for patient symptoms. Will have f/u in 6 months with our office with repeat carotid duplex.   Lemar LivingsBrandon Cain

## 2017-01-08 NOTE — Evaluation (Signed)
Speech Language Pathology Evaluation Patient Details Name: [name] [name] MRN: [id number] DOB: 02/23/1932 Today's Date: 01/08/2017 Time: [id number] SLP Time Calculation (min) (ACUTE ONLY): 26 min  Problem List:  Patient Active Problem List   Diagnosis Date Noted  . TIA (transient ischemic attack) 01/06/2017  . Chest pain with high risk for cardiac etiology 02/20/2013  . Old myocardial infarction 11/03/2011  . Shortness of breath 04/02/2010  . EDEMA 12/25/2009  . Essential hypertension 12/24/2009  . CAD, NATIVE VESSEL 12/24/2009  . Hypothyroidism 03/12/2009  . DM II (diabetes mellitus, type II), controlled (HCC) 03/12/2009  . HYPERLIPIDEMIA-MIXED 03/12/2009  . History of pulmonary embolus (PE) 03/12/2009  . PALPITATIONS 03/12/2009  . CHEST PAIN-UNSPECIFIED 03/12/2009   Past Medical History:  Past Medical History:  Diagnosis Date  . CAD (coronary artery disease)    DES tor RCA 2009.  75% ostial first diagonal stenosis, 30% proximal LAD stenosis, 85% proximal right coronary artery stenosis treated with Promus 3.5 x 18 stent.  . Diabetes mellitus   . Hyperlipidemia   . Hypertension   . Palpitations   . PE (pulmonary embolism)   . Thyroid disease    Past Surgical History:  Past Surgical History:  Procedure Laterality Date  . Abdominal cavity operation     Obstruction  . ABDOMINAL HYSTERECTOMY    . APPENDECTOMY    . CHOLECYSTECTOMY    . LEFT HEART CATHETERIZATION WITH CORONARY ANGIOGRAM N/A 02/20/2013   Procedure: LEFT HEART CATHETERIZATION WITH CORONARY ANGIOGRAM;  Surgeon: Tonny BollmanMichael Cooper, MD;  [facility];  Service: Cardiovascular;  Laterality: N/A;  . TONSILLECTOMY     HPI:  [name] [name] a 83 y.o.femalewith a past medical history of coronary artery disease with a stent to the RCA placed in 2009, history of essential hypertension, type 2 diabetes on insulin who was in her usual state of health until about 3 AM this morning when she woke up with numbness around  her mouth and then from arm to fingers and left leg.In the emergency department she was found to have a blood pressure of 185/73. Other vital signs were stable. CT head did not show any acute findings. She was thought to have a TIA or stroke. She was hospitalized for further management.   Assessment / Plan / Recommendation Clinical Impression   Pt presents with age related cognitive changes which she endorses to be baseline.  Pt compensates for these impairments with mod I at baseline and today on evaluation.  As a result, no further ST needs are indicated at this time.      SLP Assessment  SLP Recommendation/Assessment: Patient does not need any further Speech Lanaguage Pathology Services             SLP Evaluation Cognition  Overall Cognitive Status: Within Functional Limits for tasks assessed (age appropriate)       Comprehension  Auditory Comprehension Overall Auditory Comprehension: Appears within functional limits for tasks assessed    Expression Expression Primary Mode of Expression: Verbal Verbal Expression Overall Verbal Expression: Appears within functional limits for tasks assessed   Oral / Motor  Oral Motor/Sensory Function Overall Oral Motor/Sensory Function: Within functional limits Motor Speech Overall Motor Speech: Appears within functional limits for tasks assessed   GO          Functional Assessment Tool Used: cognitive-linguistic assessment Functional Limitations: Memory Memory Current Status (H0865(G9168): At least 1 percent but less than 20 percent impaired, limited or restricted Memory Goal Status (H8469(G9169): At least 1  percent but less than 20 percent impaired, limited or restricted Memory Discharge Status [phone]): At least 1 percent but less than 20 percent impaired, limited or restricted         Jackalyn Lombard L 01/08/2017, 9:40 AM

## 2017-01-08 NOTE — Progress Notes (Signed)
STROKE TEAM PROGRESS NOTE   SUBJECTIVE (INTERVAL HISTORY) She has no complaints today. CT angio neck showed less than 50% carotid stenosis hence no need for carotid revascularization. Plan to discharge today  OBJECTIVE Temp:  [97.6 F (36.4 C)-98.4 F (36.9 C)] 97.6 F (36.4 C) (06/01 0838) Pulse Rate:  [65-72] 72 (06/01 0838) Cardiac Rhythm: Heart block (06/01 0700) Resp:  [18] 18 (06/01 0420) BP: (122-154)/(40-60) 141/60 (06/01 0838) SpO2:  [93 %-100 %] 100 % (06/01 0838)  CBC:   Recent Labs Lab 01/06/17 1207 01/06/17 1216 01/07/17 0751  WBC 10.8*  --  7.7  NEUTROABS 7.8*  --   --   HGB 14.1 14.3 12.7  HCT 41.7 42.0 38.4  MCV 91.6  --  90.6  PLT 227  --  203    Basic Metabolic Panel:   Recent Labs Lab 01/06/17 1207 01/06/17 1216 01/07/17 0751  NA 136 138 136  K 4.9 4.8 3.6  CL 101 102 106  CO2 25  --  22  GLUCOSE 211* 210* 174*  BUN 17 20 12   CREATININE 1.10* 1.10* 1.03*  CALCIUM 8.6*  --  8.6*    PHYSICAL EXAM Pleasant frail elderly Caucasian lady not in distress. . Afebrile. Head is nontraumatic. Neck is supple without bruit.    Cardiac exam no murmur or gallop. Lungs are clear to auscultation. Distal pulses are well felt. Neurological Exam ;  Awake  Alert oriented x 3. Normal speech and language.eye movements full without nystagmus.fundi were not visualized. Vision acuity and fields appear normal. Hearing is normal. Palatal movements are normal. Face symmetric. Tongue midline. Normal strength, tone, reflexes and coordination. Normal sensation. Gait deferred.  ASSESSMENT/PLAN [name] is a 83 y.o. female with history of CAD, HTN, DM presenting with L sided tingling and numbness. She did not receive IV t-PA .   Right brain TIA likely small vessel disease  CT Small vessel disease. No acute process.  MRI  No acute infarct. Small vessel disease. Atrophy.   MRA  Unremarkable   Carotid Doppler  R ICA 40-59% vs 60-79%, L 1-39%. No VA  stenosis. 2D Echo  Left ventricle: The cavity size was normal. Wall thickness was   increased in a pattern of moderate LVH. Systolic function was   vigorous. The estimated ejection fraction was in the range of 65%   to 70%. Wall motion was normal; there were no regional wall    motion abnormalities.  LDL UTC  TG 559  TC 216  HgbA1c pending  Lovenox 40 mg sq daily for VTE prophylaxis Diet heart healthy/carb modified Room service appropriate? Yes; Fluid consistency: Thin Diet - low sodium heart healthy  No antithrombotic prior to admission, now on aspirin 325 mg daily  Therapy recommendations:  No PT  Disposition:  pending   Essential Hypertension  Stable  Permissive hypertension (OK if < 220/120) but gradually normalize in 5-7 days  Long-term BP goal normotensive  Hyperlipidemia  Home meds:  No statin  LDL UTC  Now on crestor 40 mg  Continue statin at discharge  Diabetes type II  HgbA1c 12.7, goal < 7.0  Uncontrolled  DB RN has reviewed and made recommendations   Other Stroke Risk Factors  Advanced age  UDS / ETOH level not performed   Coronary artery disease w/ DES  Hx PE  Other Active Problems  hypothyroidism  Hospital day # 0   Continue aspirin for stroke prevention and aggressive risk factor modification. Discharge today. Follow-up  as an outpatient in stroke clinic in 6 weeks. Discussed with Dr. Thedore Mins.  Delia Heady, MD Medical Director [facility] Pager: [phone] 01/08/2017 5:27 PM   To contact Stroke Continuity provider, please refer to [email or web address]. After hours, contact General Neurology

## 2017-01-08 NOTE — Discharge Summary (Signed)
[name] [name] ZOX:[id number] DOB: [birth year] DOA: 01/06/2017  PCP: Kari Baars, MD  Admit date: 01/06/2017  Discharge date: 01/08/2017  Admitted From: Home   Disposition:  Home   Recommendations for Outpatient Follow-up:   Follow up with PCP in 1-2 weeks  PCP Please obtain BMP/CBC, 2 view CXR in 1week,  (see Discharge instructions)   PCP Please follow up on the following pending results: Follow pending A1c, monitor lipid panel closely.   Home Health: None   Equipment/Devices: None  Consultations: Neuro, Vas surgery, Neuro IR Discharge Condition: Fair  CODE STATUS: Full   Diet Recommendation: Diet heart healthy/carb modified    Chief Complaint  Patient presents with  . Numbness     Brief history of present illness from the day of admission and additional interim summary    83 year old female with history of CAD, hypertension, type 2 diabetes mellitus insulin-dependent who was admitted to the hospital with transient left-sided tingling numbness likely due to TIA.                                                                 Hospital Course    1.TIA causing left-sided transient pending numbness. CT MRI nonacute, she had high triglycerides with LDL not calculatable, currently on aspirin along with statin and TriCor. Seen by neuro, nonspecific vascular ultrasound findings which could not be duplicated or repeated on CT angiogram of the neck, CT head did show tight right MCA territory stenosis which does not correspond to her site of complaints, she was seen by vascular surgery and neuro IR doctor the patient, I discussed the case with both. Plan is to discharge her with outpatient neuro IR follow-up. Her deficits have completely resolved and she is eager to go home will be placed on aspirin statin along with  TriCor with outpatient follow-up with PCP neurology and neuro IR.  2. Dyslipidemia. Placed on TriCor and statin.  3. CAD. Chest pain free no acute issues, on aspirin, Lopressor, statin, TriCor for secondary prevention thereafter follow with PCP and primary cardiologist.  4. Hypertension. Resumed Lopressor and home dose ARB upon discharge  5. Hypothyroidism. Continue Synthroid.  6. DM type II. Pending A1c, follow home regimen and follow with PCP for glycemic control and A1c monitoring  7. Right MCA territory stenosis. See #1 above.   Discharge diagnosis     Principal Problem:   TIA (transient ischemic attack) Active Problems:   Hypothyroidism   DM II (diabetes mellitus, type II), controlled (HCC)   Essential hypertension    Discharge instructions    Discharge Instructions    AMB Referral to [facility] Management    Complete by:  As directed    Reason for consult:  Diabetes management and coordination   Diagnoses of:  Diabetes   Expected date  of contact:  1-3 days (reserved for hospital discharges)   HTA member:  Diabetes care management.  Patient prefers some one on one education and planning needs.  Please assign to community care manager for post hospital follow up for home visit.  For questions, please contact:  Charlesetta Shanks, RN BSN CCM [facility]  [phone] business mobile phone Toll free office [phone]   Diet - low sodium heart healthy    Complete by:  As directed    Discharge instructions    Complete by:  As directed    Follow with Primary MD Kari Baars, MD in 7 days   Get CBC, CMP, 2 view Chest X ray checked  by Primary MD or SNF MD in 5-7 days ( we routinely change or add medications that can affect your baseline labs and fluid status, therefore we recommend that you get the mentioned basic workup next visit with your PCP, your PCP may decide not to get them or add new tests based on their clinical decision)  Activity:  As tolerated with Full fall precautions use walker/cane & assistance as needed  Disposition Home    Diet:   Diet heart healthy/carb modified.  For Heart failure patients - Check your Weight same time everyday, if you gain over 2 pounds, or you develop in leg swelling, experience more shortness of breath or chest pain, call your Primary MD immediately. Follow Cardiac Low Salt Diet and 1.5 lit/day fluid restriction.  On your next visit with your primary care physician please Get Medicines reviewed and adjusted.  Please request your Prim.MD to go over all Hospital Tests and Procedure/Radiological results at the follow up, please get all Hospital records sent to your Prim MD by signing hospital release before you go home.  If you experience worsening of your admission symptoms, develop shortness of breath, life threatening emergency, suicidal or homicidal thoughts you must seek medical attention immediately by calling 911 or calling your MD immediately  if symptoms less severe.  You Must read complete instructions/literature along with all the possible adverse reactions/side effects for all the Medicines you take and that have been prescribed to you. Take any new Medicines after you have completely understood and accpet all the possible adverse reactions/side effects.   Do not drive, operate heavy machinery, perform activities at heights, swimming or participation in water activities or provide baby sitting services if your were admitted for syncope or siezures until you have seen by Primary MD or a Neurologist and advised to do so again.  Do not drive when taking Pain medications.    Do not take more than prescribed Pain, Sleep and Anxiety Medications  Special Instructions: If you have smoked or chewed Tobacco  in the last 2 yrs please stop smoking, stop any regular Alcohol  and or any Recreational drug use.  Wear Seat belts while driving.   Please note  You were cared for by a hospitalist  during your hospital stay. If you have any questions about your discharge medications or the care you received while you were in the hospital after you are discharged, you can call the unit and asked to speak with the hospitalist on call if the hospitalist that took care of you is not available. Once you are discharged, your primary care physician will handle any further medical issues. Please note that NO REFILLS for any discharge medications will be authorized once you are discharged, as it is imperative that you return to your primary care  physician (or establish a relationship with a primary care physician if you do not have one) for your aftercare needs so that they can reassess your need for medications and monitor your lab values.   Increase activity slowly    Complete by:  As directed       Discharge Medications   Allergies as of 01/08/2017   No Known Allergies     Medication List    TAKE these medications   amiloride-hydrochlorothiazide 5-50 MG tablet Commonly known as:  MODURETIC Take 1 tablet by mouth daily.   aspirin 325 MG tablet Take 1 tablet (325 mg total) by mouth daily. Start taking on:  01/09/2017   fenofibrate 160 MG tablet Take 1 tablet (160 mg total) by mouth daily. Start taking on:  01/09/2017   HUMALOG 100 UNIT/ML cartridge Generic drug:  insulin lispro Inject 15-30 Units into the skin 3 (three) times daily with meals. Inject 30 units into the skin with breakfast, inject 15 units into the skin with lunch, and inject 30 units into the skin with dinner.   levothyroxine 150 MCG tablet Commonly known as:  SYNTHROID, LEVOTHROID Take 1 tablet by mouth daily before breakfast.   losartan 25 MG tablet Commonly known as:  COZAAR Take 1 tablet (25 mg total) by mouth daily.   metoprolol tartrate 50 MG tablet Commonly known as:  LOPRESSOR TAKE ONE TABLET BY MOUTH TWICE A DAY   nitroGLYCERIN 0.4 MG SL tablet Commonly known as:  NITROSTAT Place 0.4 mg under the tongue  every 5 (five) minutes as needed for chest pain (MAX of 3 doses).   rosuvastatin 40 MG tablet Commonly known as:  CRESTOR Take 1 tablet (40 mg total) by mouth daily at 6 PM.       Follow-up Information    Kari Baars, MD. Schedule an appointment as soon as possible for a visit in 1 week(s).   Specialty:  Pulmonary Disease Contact information: [address] [place] [address] [phone]        Beryle Beams, MD. Schedule an appointment as soon as possible for a visit in 2 week(s).   Specialty:  Neurology Contact information: [address] [place] [address] [phone]        Julieanne Cotton, MD. Schedule an appointment as soon as possible for a visit in 1 week(s).   Specialty:  Interventional Radiology Why:  call [phone] and schedule an appointment Contact information: [address] [place] [address] [phone]        Micki Riley, MD. Schedule an appointment as soon as possible for a visit in 1 month(s).   Specialties:  Neurology, Radiology Contact information: [address] [place] [address] [phone]           Major procedures and Radiology Reports - PLEASE review detailed and final reports thoroughly  -     MRI-CT - non acute  Echocardiogram  Left ventricle: The cavity size was normal. Wall thickness was   increased in a pattern of moderate LVH. Systolic function was   vigorous. The estimated ejection fraction was in the range of 65%   to 70%. Wall motion was normal; there were no regional wall   motion abnormalities. Doppler parameters are consistent with   abnormal left ventricular relaxation (grade 1 diastolic   dysfunction). - Aortic valve: Mildly calcified leaflets. There was no stenosis.   There was no regurgitation. - Mitral valve: Calcified annulus. Mildly thickened leaflets .  There was trivial regurgitation. - Right ventricle: Poorly  visualized.  Impressions:  - LVEF 65-70%, moderate LVH, normal wall motion, grade 1 DD with   indeterminate LV filling pressure, mildly calcified aortic valve   wtihout stenosis, trivial MR, the right heart was not   well-visualized.    Carotid duplex    Findings suggest high range 40-59% versus low range 60-79% right internal carotid artery stenosis and 1-39% left internal carotid artery stenosis. Vertebral arteries are patent with antegrade flow.   Ct Angio Head W Or Wo Contrast  Result Date: 01/07/2017 CLINICAL DATA:  83 year old female with left arm numbness. Transient ischemic attack. EXAM: CT ANGIOGRAPHY HEAD AND NECK TECHNIQUE: Multidetector CT imaging of the head and neck was performed using the standard protocol during bolus administration of intravenous contrast. Multiplanar CT image reconstructions and MIPs were obtained to evaluate the vascular anatomy. Carotid stenosis measurements (when applicable) are obtained utilizing NASCET criteria, using the distal internal carotid diameter as the denominator. CONTRAST:  50 mL Isovue 370 COMPARISON:  Brain MRI and intracranial MRA 12/07/2016 and earlier. FINDINGS: CTA NECK Skeleton: Degenerative changes in the cervical spine. Partially visible upper thoracic scoliosis. No acute osseous abnormality identified. Visualized paranasal sinuses and mastoids are stable and well pneumatized. Upper chest: Negative lung apices. No superior mediastinal lymphadenopathy. Other neck: Negative.  No cervical lymphadenopathy. Aortic arch: 3 vessel arch configuration. Calcified aortic atherosclerosis. Right carotid system: Mildly tortuous but otherwise negative right CCA. Confluent calcified plaque at the right carotid bifurcation, medial left ICA origin and bulb, but less than 50 % stenosis with respect to the distal vessel occurs. Tortuous right ICA at the C2 level. Left carotid system: Minimal calcified plaque at the left CCA origin without stenosis. Mild soft  plaque at the lateral left ICA origin. Otherwise negative cervical left ICA. Vertebral arteries: No proximal right subclavian artery stenosis. Normal right vertebral artery origin. Tortuous but otherwise normal right vertebral artery to the skullbase. No proximal left subclavian artery stenosis despite calcified plaque. Normal left vertebral artery origin. The left vertebral is mildly dominant. The left vertebral is intermittently tortuous but otherwise normal to the skullbase. CTA HEAD Posterior circulation: Mildly dominant distal left vertebral artery. The right vertebral functionally terminates in PICA. Mild left V4 segment atherosclerosis without stenosis. Patent vertebrobasilar junction. Patent but diminutive basilar artery. Patent SCA origins. Fetal type bilateral PCA origins. Normal PCA branches. Anterior circulation: Bilateral ICA siphon calcified plaque greater on the right. No hemodynamically significant siphon stenosis. Normal posterior communicating artery origins. Normal carotid termini, MCA and ACA origins. Anterior communicating artery and bilateral ACA branches are normal. Left MCA M1 segment, and bifurcation are within normal limits. There is a high-grade stenosis of of middle left MCA M2 branch affecting its proximal 5-10 mm. This is best seen today on series 9, image 136 but also visible on series 451, image 8 of the MRA comparison. Distally this branch appears normal. Other left MCA branches are within normal limits. Right MCA M1 segment, bifurcation, and right MCA branches are within normal limits. Venous sinuses: Patent. Asymmetric enhancement of the right cavernous sinus in association with a prominent right sphenoparietal sinus is felt to be physiologic. Anatomic variants: Mildly dominant left vertebral artery. Fetal type bilateral PCA origins. Delayed phase: No abnormal enhancement identified. Stable gray-white matter differentiation throughout the brain. Dystrophic calcification in the  inferior pons again noted. Review of the MIP images confirms the above findings IMPRESSION: 1. Negative for emergent large vessel occlusion. 2. Positive for a  severe stenosis at the origin of the left MCA middle M2 branch, with preserved distal patency. 3. Otherwise mild for age atherosclerosis. No other hemodynamically significant stenosis identified in the head or neck. 4.  Stable CT appearance of the brain. Electronically Signed   By: Odessa FlemingH  Hall [name]   On: 01/07/2017 19:53   Ct Head Wo Contrast  Result Date: 01/06/2017 CLINICAL DATA:  Awoke this morning with left arm and right facial numbness. No known injury. EXAM: CT HEAD WITHOUT CONTRAST TECHNIQUE: Contiguous axial images were obtained from the base of the skull through the vertex without intravenous contrast. COMPARISON:  Head CT - 04/14/2012; brain MRI - 04/14/2012 FINDINGS: Brain: Grossly unchanged scattered periventricular hypodensities compatible microvascular ischemic disease. The gray-white differentiation is otherwise well maintained without CT evidence of acute large territory infarct. No intraparenchymal or extra-axial mass or hemorrhage. Normal size and configuration of the ventricles and the basilar cisterns. No midline shift. Punctate dystrophic calcification within the right-side of the pons (image 7, series 2) is unchanged. Vascular: Atherosclerotic plaque when the bilateral carotid siphons. Skull: No displaced calvarial fracture. Sinuses/Orbits: Limited visualization the paranasal sinuses and mastoid air cells is normal. No air-fluid levels. A minimal debris is noted within the right external auditory canal. Post bilateral cataract surgery. Other: Regional soft tissues appear normal. IMPRESSION: Microvascular ischemic disease without acute intracranial process. Electronically Signed   By: Simonne ComeJohn  Watts [name]   On: 01/06/2017 13:24   Ct Angio Neck W Or Wo Contrast  Result Date: 01/07/2017 CLINICAL DATA:  83 year old female with left arm  numbness. Transient ischemic attack. EXAM: CT ANGIOGRAPHY HEAD AND NECK TECHNIQUE: Multidetector CT imaging of the head and neck was performed using the standard protocol during bolus administration of intravenous contrast. Multiplanar CT image reconstructions and MIPs were obtained to evaluate the vascular anatomy. Carotid stenosis measurements (when applicable) are obtained utilizing NASCET criteria, using the distal internal carotid diameter as the denominator. CONTRAST:  50 mL Isovue 370 COMPARISON:  Brain MRI and intracranial MRA 12/07/2016 and earlier. FINDINGS: CTA NECK Skeleton: Degenerative changes in the cervical spine. Partially visible upper thoracic scoliosis. No acute osseous abnormality identified. Visualized paranasal sinuses and mastoids are stable and well pneumatized. Upper chest: Negative lung apices. No superior mediastinal lymphadenopathy. Other neck: Negative.  No cervical lymphadenopathy. Aortic arch: 3 vessel arch configuration. Calcified aortic atherosclerosis. Right carotid system: Mildly tortuous but otherwise negative right CCA. Confluent calcified plaque at the right carotid bifurcation, medial left ICA origin and bulb, but less than 50 % stenosis with respect to the distal vessel occurs. Tortuous right ICA at the C2 level. Left carotid system: Minimal calcified plaque at the left CCA origin without stenosis. Mild soft plaque at the lateral left ICA origin. Otherwise negative cervical left ICA. Vertebral arteries: No proximal right subclavian artery stenosis. Normal right vertebral artery origin. Tortuous but otherwise normal right vertebral artery to the skullbase. No proximal left subclavian artery stenosis despite calcified plaque. Normal left vertebral artery origin. The left vertebral is mildly dominant. The left vertebral is intermittently tortuous but otherwise normal to the skullbase. CTA HEAD Posterior circulation: Mildly dominant distal left vertebral artery. The right  vertebral functionally terminates in PICA. Mild left V4 segment atherosclerosis without stenosis. Patent vertebrobasilar junction. Patent but diminutive basilar artery. Patent SCA origins. Fetal type bilateral PCA origins. Normal PCA branches. Anterior circulation: Bilateral ICA siphon calcified plaque greater on the right. No hemodynamically significant siphon stenosis. Normal posterior communicating artery origins. Normal carotid termini, MCA and ACA  origins. Anterior communicating artery and bilateral ACA branches are normal. Left MCA M1 segment, and bifurcation are within normal limits. There is a high-grade stenosis of of middle left MCA M2 branch affecting its proximal 5-10 mm. This is best seen today on series 9, image 136 but also visible on series 451, image 8 of the MRA comparison. Distally this branch appears normal. Other left MCA branches are within normal limits. Right MCA M1 segment, bifurcation, and right MCA branches are within normal limits. Venous sinuses: Patent. Asymmetric enhancement of the right cavernous sinus in association with a prominent right sphenoparietal sinus is felt to be physiologic. Anatomic variants: Mildly dominant left vertebral artery. Fetal type bilateral PCA origins. Delayed phase: No abnormal enhancement identified. Stable gray-white matter differentiation throughout the brain. Dystrophic calcification in the inferior pons again noted. Review of the MIP images confirms the above findings IMPRESSION: 1. Negative for emergent large vessel occlusion. 2. Positive for a severe stenosis at the origin of the left MCA middle M2 branch, with preserved distal patency. 3. Otherwise mild for age atherosclerosis. No other hemodynamically significant stenosis identified in the head or neck. 4.  Stable CT appearance of the brain. Electronically Signed   By: Odessa Fleming [name]   On: 01/07/2017 19:53   Mr Brain Wo Contrast  Result Date: 01/06/2017 CLINICAL DATA:  Initial evaluation for acute  left arm numbness with right facial numbness. EXAM: MRI HEAD WITHOUT CONTRAST MRA HEAD WITHOUT CONTRAST TECHNIQUE: Multiplanar, multiecho pulse sequences of the brain and surrounding structures were obtained without intravenous contrast. Angiographic images of the head were obtained using MRA technique without contrast. COMPARISON:  Prior CT from earlier the same day. FINDINGS: MRI HEAD FINDINGS Brain: Mild age-related cerebral atrophy. Patchy and confluent T2/FLAIR hyperintensity involving the periventricular and deep white matter both cerebral hemispheres most like related chronic small vessel ischemic disease, extremely mild for age. No abnormal foci of restricted diffusion to suggest acute or subacute ischemia. Gray-white matter differentiation well maintained. No encephalomalacia to suggest chronic infarction. No evidence for acute intracranial hemorrhage. Single subcentimeter focus of susceptibility artifact noted within and left pons, of doubtful significance in isolation. No other evidence for chronic hemorrhage. No mass lesion, midline shift or mass effect. No hydrocephalus. No extra-axial fluid collection. Major dural sinuses are grossly patent. Pituitary gland suprasellar region within normal limits. Midline structures intact and normal. Vascular: Major intracranial vascular flow voids are maintained. Skull and upper cervical spine: Craniocervical junction within normal limits. Bone marrow signal intensity within normal limits. No scalp soft tissue abnormality. Sinuses/Orbits: Globes and orbital soft tissues demonstrate no acute abnormality. Patient status post lens extraction bilaterally. Flattening of the posterior aspect of the left globe noted. Paranasal sinuses are clear. No mastoid effusion. Inner ear structures normal. Other: None. MRA HEAD FINDINGS ANTERIOR CIRCULATION: Distal cervical segments of the internal carotid arteries are widely patent with antegrade flow. Petrous, cavernous, and  supraclinoid segments patent without stenosis. ICA termini widely patent. A1 segments, anterior communicating artery common anterior cerebral arteries widely patent. M1 segments patent without stenosis or occlusion. No proximal M2 occlusion. Distal MCA branches well opacified and symmetric. POSTERIOR CIRCULATION: Vertebral artery is diminutive bilaterally, with the left vertebral artery slightly dominant. Left vertebral artery patent to the vertebrobasilar junction without stenosis. Right vertebral artery largely terminates in PICA. Posterior inferior cerebral arteries are patent proximally. Basilar artery diminutive but patent to its distal aspect. Superior cerebral arteries patent bilaterally. Fetal type PCAs supplied via widely patent posterior communicating arteries. PCAs  are patent to their distal aspects without stenosis. No aneurysm or vascular malformation. IMPRESSION: MRI HEAD IMPRESSION: 1. No acute intracranial infarct or other process identified. 2. Mild for age cerebral atrophy with chronic microvascular ischemic disease. MRA HEAD IMPRESSION: Normal intracranial MRA. Electronically Signed   By: Rise Mu [name]   On: 01/06/2017 21:15   Mr Maxine Glenn Headm  Result Date: 01/06/2017 CLINICAL DATA:  Initial evaluation for acute left arm numbness with right facial numbness. EXAM: MRI HEAD WITHOUT CONTRAST MRA HEAD WITHOUT CONTRAST TECHNIQUE: Multiplanar, multiecho pulse sequences of the brain and surrounding structures were obtained without intravenous contrast. Angiographic images of the head were obtained using MRA technique without contrast. COMPARISON:  Prior CT from earlier the same day. FINDINGS: MRI HEAD FINDINGS Brain: Mild age-related cerebral atrophy. Patchy and confluent T2/FLAIR hyperintensity involving the periventricular and deep white matter both cerebral hemispheres most like related chronic small vessel ischemic disease, extremely mild for age. No abnormal foci of restricted diffusion  to suggest acute or subacute ischemia. Gray-white matter differentiation well maintained. No encephalomalacia to suggest chronic infarction. No evidence for acute intracranial hemorrhage. Single subcentimeter focus of susceptibility artifact noted within and left pons, of doubtful significance in isolation. No other evidence for chronic hemorrhage. No mass lesion, midline shift or mass effect. No hydrocephalus. No extra-axial fluid collection. Major dural sinuses are grossly patent. Pituitary gland suprasellar region within normal limits. Midline structures intact and normal. Vascular: Major intracranial vascular flow voids are maintained. Skull and upper cervical spine: Craniocervical junction within normal limits. Bone marrow signal intensity within normal limits. No scalp soft tissue abnormality. Sinuses/Orbits: Globes and orbital soft tissues demonstrate no acute abnormality. Patient status post lens extraction bilaterally. Flattening of the posterior aspect of the left globe noted. Paranasal sinuses are clear. No mastoid effusion. Inner ear structures normal. Other: None. MRA HEAD FINDINGS ANTERIOR CIRCULATION: Distal cervical segments of the internal carotid arteries are widely patent with antegrade flow. Petrous, cavernous, and supraclinoid segments patent without stenosis. ICA termini widely patent. A1 segments, anterior communicating artery common anterior cerebral arteries widely patent. M1 segments patent without stenosis or occlusion. No proximal M2 occlusion. Distal MCA branches well opacified and symmetric. POSTERIOR CIRCULATION: Vertebral artery is diminutive bilaterally, with the left vertebral artery slightly dominant. Left vertebral artery patent to the vertebrobasilar junction without stenosis. Right vertebral artery largely terminates in PICA. Posterior inferior cerebral arteries are patent proximally. Basilar artery diminutive but patent to its distal aspect. Superior cerebral arteries patent  bilaterally. Fetal type PCAs supplied via widely patent posterior communicating arteries. PCAs are patent to their distal aspects without stenosis. No aneurysm or vascular malformation. IMPRESSION: MRI HEAD IMPRESSION: 1. No acute intracranial infarct or other process identified. 2. Mild for age cerebral atrophy with chronic microvascular ischemic disease. MRA HEAD IMPRESSION: Normal intracranial MRA. Electronically Signed   By: Rise Mu [name]   On: 01/06/2017 21:15    Micro Results     No results found for this or any previous visit (from the past 240 hour(s)).  Today   Subjective    [name] [name] today has no headache,no chest abdominal pain,no new weakness tingling or numbness, feels much better wants to go home today.     Objective   Blood pressure (!) 141/60, pulse 72, temperature 97.6 F (36.4 C), temperature source Oral, resp. rate 18, height 5' 3.5" (1.613 m), weight 63.5 kg (140 lb), SpO2 100 %.   Intake/Output Summary (Last 24 hours) at 01/08/17 1027 Last data filed at  01/08/17 0942  Gross per 24 hour  Intake              675 ml  Output                0 ml  Net              675 ml    Exam Awake Alert, Oriented x 3, No new F.N deficits, Normal affect [place].AT,PERRAL Supple Neck,No JVD, No cervical lymphadenopathy appriciated.  Symmetrical Chest wall movement, Good air movement bilaterally, CTAB RRR,No Gallops,Rubs or new Murmurs, No Parasternal Heave +ve B.Sounds, Abd Soft, Non tender, No organomegaly appriciated, No rebound -guarding or rigidity. No Cyanosis, Clubbing or edema, No new Rash or bruise   Data Review   CBC w Diff: Lab Results  Component Value Date   WBC 7.7 01/07/2017   HGB 12.7 01/07/2017   HCT 38.4 01/07/2017   PLT 203 01/07/2017   LYMPHOPCT 19 01/06/2017   MONOPCT 7 01/06/2017   EOSPCT 2 01/06/2017   BASOPCT 1 01/06/2017    CMP: Lab Results  Component Value Date   NA 136 01/07/2017   K 3.6 01/07/2017   CL 106 01/07/2017   CO2  22 01/07/2017   BUN 12 01/07/2017   CREATININE 1.03 (H) 01/07/2017   PROT 7.4 01/06/2017   ALBUMIN 3.7 01/06/2017   BILITOT 0.7 01/06/2017   ALKPHOS 104 01/06/2017   AST 34 01/06/2017   ALT 23 01/06/2017   Lab Results  Component Value Date   CHOL 216 (H) 01/07/2017   HDL 30 (L) 01/07/2017   LDLCALC UNABLE TO CALCULATE IF TRIGLYCERIDE OVER 400 mg/dL 78/29/5621   TRIG 308 (H) 01/07/2017   CHOLHDL 7.2 01/07/2017     Total Time in preparing paper work, data evaluation and todays exam - 35 minutes  Susa Raring M.D on 01/08/2017 at 10:27 AM  [facility]   Office  [phone]

## 2017-01-08 NOTE — Progress Notes (Signed)
Patient ID: [name] [name], female   DOB: 06/18/1932, 83 y.o.   MRN: [id number]   Micah FlesherWent to see pt with Dr Corliss Skainseveshwar  Speech therapy in room Will return later to discuss L MCA stenosis  Pt agreeable

## 2017-01-08 NOTE — Plan of Care (Signed)
Problem: Education: Goal: Knowledge of patient specific risk factors addressed and post discharge goals established will improve DM, HLD, HTN and perfusion education provided to patient and family. Patients current treatment plan reviewed, discussed and patient v/u.  Problem: Tissue Perfusion: Goal: Cerebral tissue perfusion will improve (applicable to all stroke diagnoses) Outcome: Progressing CTA complete. Patient and treatment team plan to discuss 01/08/17.

## 2017-01-08 NOTE — Care Management Note (Signed)
Case Management Note  Patient Details  Name: Nehemiah Massedlma S Sliney MRN: [id number] Date of Birth: 01/08/1932  Subjective/Objective:                    Action/Plan: Pt discharging home with self care. No f/u per PT/OT and no DME needs. Pt has insurance, PCP and transportation home.   Expected Discharge Date:  01/08/17               Expected Discharge Plan:  Home/Self Care  In-House Referral:     Discharge planning Services     Post Acute Care Choice:    Choice offered to:     DME Arranged:    DME Agency:     HH Arranged:    HH Agency:     Status of Service:  Completed, signed off  If discussed at [employer] Length of Stay Meetings, dates discussed:    Additional Comments:  Kermit BaloKelli F Nuchem Grattan, RN 01/08/2017, 12:48 PM

## 2017-01-08 NOTE — Telephone Encounter (Signed)
-----   Message from Sharee PimpleMarilyn K McChesney, RN sent at 01/08/2017  9:38 AM EDT ----- Regarding: 6 month   ----- Message ----- From: Maeola Harmanain, Brandon Christopher, MD Sent: 01/08/2017   8:12 AM To: Vvs Charge Pool  F/u in 6 months with carotid artery duplex  Dx: carotid stenosis  F/u can be with me or np/pa

## 2017-01-11 ENCOUNTER — Telehealth: Payer: Self-pay | Admitting: *Deleted

## 2017-01-11 NOTE — Patient Outreach (Signed)
[facility]) Care Management  01/11/2017  [name] [name] 06/06/1932 [id number]   Transition of care [facility]N CM made contact with [name] [name] who states she [facility] an [facility] after she has confirmed her f/u appointment with Dr Juanetta GoslingHawkins office.  [facility] CM offered to [facility] CM availability to for DM disease management teaching on Thursday or Friday this week She informed CM she had [place] and she again prefers to await a follow up appointment to see Dr Juanetta GoslingHawkins. Pending a call to her from the office [facility]N Cm unable to see appointments for Dr Juanetta GoslingHawkins and requests a return call to [facility] Cm to engage with her when convenient for her Transition of care assessment began prior to [name] Iran OuchStrader conclusion of the call Denied that she is being followed by a CV, Endocrinologist, neurologist or "kidney provider" Has medications, understands d/c instructions and has DME per pt   Plans Pending her appt wth Dr Juanetta GoslingHawkins [facility] Cm will attempt to re engage pt for DM disease management education Routed note to Dr Maren BeachHawkins  Dontrell Stuck L. Noelle PennerGibbs, RN, BSN, CCM [facility] Care Management [phone]

## 2017-01-12 ENCOUNTER — Other Ambulatory Visit: Payer: Self-pay | Admitting: Internal Medicine

## 2017-01-12 ENCOUNTER — Other Ambulatory Visit: Payer: Self-pay | Admitting: *Deleted

## 2017-01-12 DIAGNOSIS — I771 Stricture of artery: Secondary | ICD-10-CM

## 2017-01-12 NOTE — Patient Outreach (Signed)
[facility](THN) Care Management  01/12/2017  [name] [name] 02/21/1932 [id number]   [facility] CM spoke with [name] on 01/11/17 at 1111 to start transition of care services. She reported not having a follow up appt with Dr Juanetta GoslingHawkins  CM called Dr Juanetta GoslingHawkins office to speak with [name] LagerYolanda who offered a 01/22/17 appt for pt  [facility] CM called [name] on today 01/12/17 to informed of of the 01/22/17 appt Encouraged her to call to Dr Juanetta GoslingHawkins office to confirm time of appt.  She reports not having issue with transportation to get to the appt Reports she lives close by.  She continues to report having all her medications and no questions about d/c instructions.  CM inquired about home health and she reports she was not d/c with home health services but would check with Dr Juanetta GoslingHawkins CM inquired about home visit but pt would not offer a day [facility]N CM could visit her   [facility]ans [facility] CM will continue to contact pt to follow up on her transition to home and attempt to identify any other needs plus offer DM education/assessment  Kimberly L. Noelle PennerGibbs, RN, BSN, CCM [facility] Care Management [phone]

## 2017-01-18 ENCOUNTER — Ambulatory Visit (HOSPITAL_COMMUNITY): Admission: RE | Admit: 2017-01-18 | Payer: PPO | Source: Ambulatory Visit

## 2017-01-18 ENCOUNTER — Telehealth (HOSPITAL_COMMUNITY): Payer: Self-pay | Admitting: Radiology

## 2017-01-18 NOTE — Telephone Encounter (Signed)
Called pt to reschedule her consult. [name] [name] is out sick today. Left her a VM to call to change her appt JM

## 2017-01-21 ENCOUNTER — Ambulatory Visit (HOSPITAL_COMMUNITY)
Admission: RE | Admit: 2017-01-21 | Discharge: 2017-01-21 | Disposition: A | Discharge status: Home / Self Care | Payer: PPO | Source: Ambulatory Visit | Attending: Internal Medicine | Admitting: Internal Medicine

## 2017-01-21 ENCOUNTER — Other Ambulatory Visit (HOSPITAL_COMMUNITY): Payer: Self-pay | Admitting: Interventional Radiology

## 2017-01-21 DIAGNOSIS — I6603 Occlusion and stenosis of bilateral middle cerebral arteries: Secondary | ICD-10-CM | POA: Diagnosis not present

## 2017-01-21 DIAGNOSIS — I771 Stricture of artery: Secondary | ICD-10-CM

## 2017-01-21 HISTORY — PX: IR RADIOLOGIST EVAL & MGMT: IMG5224

## 2017-01-26 ENCOUNTER — Encounter (HOSPITAL_COMMUNITY): Payer: Self-pay | Admitting: Interventional Radiology

## 2017-01-27 ENCOUNTER — Other Ambulatory Visit: Payer: Self-pay | Admitting: Student

## 2017-01-27 ENCOUNTER — Other Ambulatory Visit: Payer: Self-pay | Admitting: Radiology

## 2017-01-28 ENCOUNTER — Ambulatory Visit (HOSPITAL_COMMUNITY)
Admission: RE | Admit: 2017-01-28 | Discharge: 2017-01-28 | Disposition: A | Discharge status: Home / Self Care | Payer: PPO | Source: Ambulatory Visit | Attending: Interventional Radiology | Admitting: Interventional Radiology

## 2017-01-28 ENCOUNTER — Other Ambulatory Visit (HOSPITAL_COMMUNITY): Payer: Self-pay | Admitting: Interventional Radiology

## 2017-01-28 ENCOUNTER — Encounter (HOSPITAL_COMMUNITY): Payer: Self-pay

## 2017-01-28 DIAGNOSIS — R531 Weakness: Secondary | ICD-10-CM | POA: Diagnosis not present

## 2017-01-28 DIAGNOSIS — I2510 Atherosclerotic heart disease of native coronary artery without angina pectoris: Secondary | ICD-10-CM | POA: Diagnosis not present

## 2017-01-28 DIAGNOSIS — E079 Disorder of thyroid, unspecified: Secondary | ICD-10-CM | POA: Insufficient documentation

## 2017-01-28 DIAGNOSIS — E785 Hyperlipidemia, unspecified: Secondary | ICD-10-CM | POA: Diagnosis not present

## 2017-01-28 DIAGNOSIS — E119 Type 2 diabetes mellitus without complications: Secondary | ICD-10-CM | POA: Diagnosis not present

## 2017-01-28 DIAGNOSIS — I6603 Occlusion and stenosis of bilateral middle cerebral arteries: Secondary | ICD-10-CM | POA: Insufficient documentation

## 2017-01-28 DIAGNOSIS — I771 Stricture of artery: Secondary | ICD-10-CM

## 2017-01-28 DIAGNOSIS — Z86711 Personal history of pulmonary embolism: Secondary | ICD-10-CM | POA: Diagnosis not present

## 2017-01-28 DIAGNOSIS — Z955 Presence of coronary angioplasty implant and graft: Secondary | ICD-10-CM | POA: Insufficient documentation

## 2017-01-28 DIAGNOSIS — I10 Essential (primary) hypertension: Secondary | ICD-10-CM | POA: Diagnosis not present

## 2017-01-28 HISTORY — PX: IR ANGIO INTRA EXTRACRAN SEL COM CAROTID INNOMINATE BILAT MOD SED: IMG5360

## 2017-01-28 HISTORY — PX: IR ANGIO VERTEBRAL SEL SUBCLAVIAN INNOMINATE BILAT MOD SED: IMG5366

## 2017-01-28 LAB — CBC
HEMATOCRIT: 40.6 % (ref 36.0–46.0)
HEMOGLOBIN: 13.6 g/dL (ref 12.0–15.0)
MCH: 30.6 pg (ref 26.0–34.0)
MCHC: 33.5 g/dL (ref 30.0–36.0)
MCV: 91.4 fL (ref 78.0–100.0)
Platelets: 226 10*3/uL (ref 150–400)
RBC: 4.44 MIL/uL (ref 3.87–5.11)
RDW: 12.5 % (ref 11.5–15.5)
WBC: 11 10*3/uL — ABNORMAL HIGH (ref 4.0–10.5)

## 2017-01-28 LAB — BASIC METABOLIC PANEL
ANION GAP: 10 (ref 5–15)
BUN: 26 mg/dL — ABNORMAL HIGH (ref 6–20)
CO2: 24 mmol/L (ref 22–32)
Calcium: 9 mg/dL (ref 8.9–10.3)
Chloride: 104 mmol/L (ref 101–111)
Creatinine, Ser: 1.73 mg/dL — ABNORMAL HIGH (ref 0.44–1.00)
GFR, EST AFRICAN AMERICAN: 30 mL/min — AB (ref >60)
GFR, EST NON AFRICAN AMERICAN: 26 mL/min — AB (ref >60)
Glucose, Bld: 192 mg/dL — ABNORMAL HIGH (ref 65–99)
POTASSIUM: 3.9 mmol/L (ref 3.5–5.1)
Sodium: 138 mmol/L (ref 135–145)

## 2017-01-28 LAB — PROTIME-INR
INR: 1.04
PROTHROMBIN TIME: 13.7 s (ref 11.4–15.2)

## 2017-01-28 LAB — APTT: APTT: 23 s — AB (ref 24–36)

## 2017-01-28 LAB — POCT ACTIVATED CLOTTING TIME: Activated Clotting Time: 147 seconds

## 2017-01-28 MED ORDER — IOPAMIDOL (ISOVUE-300) INJECTION 61%
INTRAVENOUS | Status: AC
  Filled 2017-01-28: qty 50

## 2017-01-28 MED ORDER — SODIUM CHLORIDE 0.9 % IV SOLN: Freq: Once | INTRAVENOUS | Status: DC

## 2017-01-28 MED ORDER — FENTANYL CITRATE (PF) 100 MCG/2ML IJ SOLN
INTRAMUSCULAR | Status: AC
  Filled 2017-01-28: qty 2

## 2017-01-28 MED ORDER — FENTANYL CITRATE (PF) 100 MCG/2ML IJ SOLN
INTRAMUSCULAR | Status: AC | PRN
  Administered 2017-01-28: 25 ug via INTRAVENOUS

## 2017-01-28 MED ORDER — HEPARIN SODIUM (PORCINE) 1000 UNIT/ML IJ SOLN
INTRAMUSCULAR | Status: AC | PRN
  Administered 2017-01-28: 500 [IU] via INTRAVENOUS
  Administered 2017-01-28: 1000 [IU] via INTRAVENOUS

## 2017-01-28 MED ORDER — IOPAMIDOL (ISOVUE-300) INJECTION 61%
INTRAVENOUS | Status: AC
  Administered 2017-01-28: 95 mL
  Filled 2017-01-28: qty 150

## 2017-01-28 MED ORDER — LIDOCAINE HCL (PF) 1 % IJ SOLN
INTRAMUSCULAR | Status: AC
  Filled 2017-01-28: qty 30

## 2017-01-28 MED ORDER — LIDOCAINE HCL (PF) 1 % IJ SOLN
INTRAMUSCULAR | Status: AC | PRN
  Administered 2017-01-28: 10 mL

## 2017-01-28 MED ORDER — SODIUM CHLORIDE 0.9 % IV SOLN
INTRAVENOUS | Status: AC | PRN
  Administered 2017-01-28: 75 mL/h via INTRAVENOUS

## 2017-01-28 MED ORDER — MIDAZOLAM HCL 2 MG/2ML IJ SOLN
INTRAMUSCULAR | Status: AC
Start: 2017-01-28 — End: 2017-01-28
  Filled 2017-01-28: qty 2

## 2017-01-28 MED ORDER — HEPARIN SODIUM (PORCINE) 1000 UNIT/ML IJ SOLN
INTRAMUSCULAR | Status: AC
  Filled 2017-01-28: qty 2

## 2017-01-28 MED ORDER — SODIUM CHLORIDE 0.9 % IV SOLN: INTRAVENOUS | Status: AC

## 2017-01-28 MED ORDER — IOPAMIDOL (ISOVUE-300) INJECTION 61%
INTRAVENOUS | Status: AC
  Filled 2017-01-28: qty 150

## 2017-01-28 MED ORDER — MIDAZOLAM HCL 2 MG/2ML IJ SOLN
INTRAMUSCULAR | Status: AC | PRN
  Administered 2017-01-28: 1 mg via INTRAVENOUS

## 2017-01-28 NOTE — Sedation Documentation (Signed)
Sheath to right groin removed. V-Pad applied 

## 2017-01-28 NOTE — Sedation Documentation (Addendum)
ETC02 removed per Dr. Deveshwar  

## 2017-01-28 NOTE — H&P (Signed)
Chief Complaint: L MCA stenosis  Referring Physician: Dr. Antony Contras  Supervising Physician: Luanne Bras  Patient Status: [facility] - Out-pt  HPI: [name] [name] is a 83 y.o. female who is right-handed lady who has been referred for evaluation of management of a recently discovered intracranial arterial stenoses.  The patient was admitted on 01/06/2017 with symptoms of left-sided facial arm and leg numbness and paresthesias with no significant stenosis.  This gradually resolved. The patient was admitted and underwent a workup for stroke prevention.  CT angiogram of the head and neck, and an MRI and MRA of the brain were performed as part of the workup. These revealed the presence of a suspected high-grade stenosis of the inferior division of the left middle cerebral artery proximally. Also noted was calcific atherosclerosis with narrowing of the right carotid bulb.  The patient underwent brief physical therapy and and occupational therapy whilst in the hospital. She was then discharged home under the care of her friend and her husband.  She presents today for a cerebral angiogram.  Past Medical History:  Past Medical History:  Diagnosis Date  . CAD (coronary artery disease)    DES tor RCA 2009.  75% ostial first diagonal stenosis, 30% proximal LAD stenosis, 85% proximal right coronary artery stenosis treated with Promus 3.5 x 18 stent.  . Diabetes mellitus   . Hyperlipidemia   . Hypertension   . Palpitations   . PE (pulmonary embolism)   . Thyroid disease     Past Surgical History:  Past Surgical History:  Procedure Laterality Date  . Abdominal cavity operation     Obstruction  . ABDOMINAL HYSTERECTOMY    . APPENDECTOMY    . CHOLECYSTECTOMY    . IR RADIOLOGIST EVAL & MGMT  01/21/2017  . LEFT HEART CATHETERIZATION WITH CORONARY ANGIOGRAM N/A 02/20/2013   Procedure: LEFT HEART CATHETERIZATION WITH CORONARY ANGIOGRAM;  Surgeon: Sherren Mocha, MD;   Location: [facility] CATH LAB;  Service: Cardiovascular;  Laterality: N/A;  . TONSILLECTOMY      Family History:  Family History  Problem Relation Age of Onset  . Cancer Mother        lung  . Cancer Father        lung  . Aneurysm Brother   . Coronary artery disease Unknown   . Hypertension Unknown     Social History:  reports that she has never smoked. She has never used smokeless tobacco. She reports that she does not drink alcohol or use drugs.  Allergies: No Known Allergies  Medications: Medications reviewed in epic  Please HPI for pertinent positives, otherwise complete 10 system ROS negative.  Mallampati Score: MD Evaluation Airway: WNL Heart: WNL Abdomen: WNL Chest/ Lungs: WNL ASA  Classification: 3 Mallampati/Airway Score: Two  Physical Exam: BP (!) 167/64   Pulse (!) 105   Temp 97.7 F (36.5 C) (Oral)   Resp 16   Ht 5' 3" (1.6 m)   Wt 140 lb (63.5 kg)   SpO2 92%   BMI 24.80 kg/m  Body mass index is 24.8 kg/m. General: pleasant, WD, WN white female who is laying in bed in NAD HEENT: head is normocephalic, atraumatic.  Sclera are noninjected.  PERRL.  Ears and nose without any masses or lesions.  Mouth is pink and moist Heart: regular, rate, and rhythm.  Normal s1,s2. No obvious murmurs, gallops, or rubs noted.  Palpable radial and pedal pulses bilaterally Lungs: CTAB, no wheezes, rhonchi, or rales noted.  Respiratory effort  nonlabored Abd: soft, NT, ND, +BS, no masses, hernias, or organomegaly Psych: A&Ox3 with an appropriate affect.   Labs: Results for orders placed or performed during the hospital encounter of 01/28/17 (from the past 48 hour(s))  APTT     Status: Abnormal   Collection Time: 01/28/17  7:04 AM  Result Value Ref Range   aPTT 23 (L) 24 - 36 seconds  Basic metabolic panel     Status: Abnormal   Collection Time: 01/28/17  7:04 AM  Result Value Ref Range   Sodium 138 135 - 145 mmol/L   Potassium 3.9 3.5 - 5.1 mmol/L   Chloride 104 101 - 111  mmol/L   CO2 24 22 - 32 mmol/L   Glucose, Bld 192 (H) 65 - 99 mg/dL   BUN 26 (H) 6 - 20 mg/dL   Creatinine, Ser 1.73 (H) 0.44 - 1.00 mg/dL   Calcium 9.0 8.9 - 10.3 mg/dL   GFR calc non Af Amer 26 (L) >60 mL/min   GFR calc Af Amer 30 (L) >60 mL/min    Comment: (NOTE) The eGFR has been calculated using the CKD EPI equation. This calculation has not been validated in all clinical situations. eGFR's persistently <60 mL/min signify possible Chronic Kidney Disease.    Anion gap 10 5 - 15  CBC     Status: Abnormal   Collection Time: 01/28/17  7:04 AM  Result Value Ref Range   WBC 11.0 (H) 4.0 - 10.5 K/uL   RBC 4.44 3.87 - 5.11 MIL/uL   Hemoglobin 13.6 12.0 - 15.0 g/dL   HCT 40.6 36.0 - 46.0 %   MCV 91.4 78.0 - 100.0 fL   MCH 30.6 26.0 - 34.0 pg   MCHC 33.5 30.0 - 36.0 g/dL   RDW 12.5 11.5 - 15.5 %   Platelets 226 150 - 400 K/uL  Protime-INR     Status: None   Collection Time: 01/28/17  7:04 AM  Result Value Ref Range   Prothrombin Time 13.7 11.4 - 15.2 seconds   INR 1.04     Imaging: No results found.  Assessment/Plan 1. Left MCA stenosis  We will plan to proceed today with a diagnostic cerebral angiogram.  Her labs and vitals have been reviewed. Risks and Benefits discussed with the patient including, but not limited to bleeding, infection, vascular injury or contrast induced renal failure. All of the patient's questions were answered, patient is agreeable to proceed. Consent signed and in chart.  Thank you for this interesting consult.  I greatly enjoyed meeting [name] [name] and look forward to participating in their care.  A copy of this report was sent to the requesting provider on this date.  Electronically Signed: OSBORNE,KELLY E 01/28/2017, 8:42 AM   I spent a total of    25 Minutes in face to face in clinical consultation, greater than 50% of which was counseling/coordinating care for left MCA stenosis   

## 2017-01-28 NOTE — Discharge Instructions (Addendum)
Cerebral Angiogram, Care After °Refer to this sheet in the next few weeks. These instructions provide you with information on caring for yourself after your procedure. Your health care provider may also give you more specific instructions. Your treatment has been planned according to current medical practices, but problems sometimes occur. Call your health care provider if you have any problems or questions after your procedure. °What can I expect after the procedure? °After your procedure, it is typical to have the following: °· Bruising at the catheter insertion site that usually fades within 1-2 weeks. °· Blood collecting in the tissue (hematoma) that may be painful to the touch. It should usually decrease in size and tenderness within 1-2 weeks. °· A mild headache. ° °Follow these instructions at home: °· Take medicines only as directed by your health care provider. °· You may shower 24-48 hours after the procedure or as directed by your health care provider. Remove the bandage (dressing) and gently wash the site with plain soap and water. Pat the area dry with a clean towel. Do not rub the site, because this may cause bleeding. °· Do not take baths, swim, or use a hot tub until your health care provider approves. °· Check your insertion site every day for redness, swelling, or drainage. °· Do not apply powder or lotion to the site. °· Do not lift over 10 lb (4.5 kg) for 5 days after your procedure or as directed by your health care provider. °· Ask your health care provider when it is okay to: °? Return to work or school. °? Resume usual physical activities or sports. °? Resume sexual activity. °· Do not drive home if you are discharged the same day as the procedure. Have someone else drive you. °· You may drive 24 hours after the procedure unless otherwise instructed by your health care provider. °· Do not operate machinery or power tools for 24 hours after the procedure or as directed by your health care  provider. °· If your procedure was done as an outpatient procedure, which means that you went home the same day as your procedure, a responsible adult should be with you for the first 24 hours after you arrive home. °· Keep all follow-up visits as directed by your health care provider. This is important. °Contact a health care provider if: °· You have a fever. °· You have chills. °· You have increased bleeding from the catheter insertion site. Hold pressure on the site. °Get help right away if: °· You have vision changes or loss of vision. °· You have numbness or weakness on one side of your body. °· You have difficulty talking, or you have slurred speech or cannot speak (aphasia). °· You feel confused or have difficulty remembering. °· You have unusual pain at the catheter insertion site. °· You have redness, warmth, or swelling at the catheter insertion site. °· You have drainage (other than a small amount of blood on the dressing) from the catheter insertion site. °· The catheter insertion site is bleeding, and the bleeding does not stop after 30 minutes of holding steady pressure on the site. °These symptoms may represent a serious problem that is an emergency. Do not wait to see if the symptoms will go away. Get medical help right away. Call your local emergency services (911 in [place] Do not drive yourself to the hospital. °This information is not intended to replace advice given to you by your health care provider. Make sure you discuss any questions   you have with your health care provider. °Document Released: 12/11/2013 Document Revised: 01/02/2016 Document Reviewed: 08/09/2013 °Elsevier Interactive Patient Education © 2017 Elsevier Inc[employer] °Moderate Conscious Sedation, Adult, Care After °These instructions provide you with information about caring for yourself after your procedure. Your health care provider may also give you more specific instructions. Your treatment has been planned according to current  medical practices, but problems sometimes occur. Call your health care provider if you have any problems or questions after your procedure. °What can I expect after the procedure? °After your procedure, it is common: °· To feel sleepy for several hours. °· To feel clumsy and have poor balance for several hours. °· To have poor judgment for several hours. °· To vomit if you eat too soon. ° °Follow these instructions at home: °For at least 24 hours after the procedure: ° °· Do not: °? Participate in activities where you could fall or become injured. °? Drive. °? Use heavy machinery. °? Drink alcohol. °? Take sleeping pills or medicines that cause drowsiness. °? Make important decisions or sign legal documents. °? Take care of children on your own. °· Rest. °Eating and drinking °· Follow the diet recommended by your health care provider. °· If you vomit: °? Drink water, juice, or soup when you can drink without vomiting. °? Make sure you have little or no nausea before eating solid foods. °General instructions °· Have a responsible adult stay with you until you are awake and alert. °· Take over-the-counter and prescription medicines only as told by your health care provider. °· If you smoke, do not smoke without supervision. °· Keep all follow-up visits as told by your health care provider. This is important. °Contact a health care provider if: °· You keep feeling nauseous or you keep vomiting. °· You feel light-headed. °· You develop a rash. °· You have a fever. °Get help right away if: °· You have trouble breathing. °This information is not intended to replace advice given to you by your health care provider. Make sure you discuss any questions you have with your health care provider. °Document Released: 05/17/2013 Document Revised: 12/30/2015 Document Reviewed: 11/16/2015 °Elsevier Interactive Patient Education © 2018 Elsevier Inc. ° °

## 2017-01-28 NOTE — Procedures (Signed)
S/P 4 vessel cerebral arteriogram. RT CFA approach. Findings. . Approx 75 % stenosis of Lt MCA inferior division prox. 2.Approx 50 % stenosis RT MCA M1 segment

## 2017-01-29 ENCOUNTER — Other Ambulatory Visit: Payer: Self-pay | Admitting: *Deleted

## 2017-01-29 NOTE — Patient Outreach (Signed)
[facility]N) Care Management  01/29/2017  [name] [name] 04/22/1932 [id number]   [facility] CM called and spoke with [name] [name] for transition of care services.  She informs [facility]N CM she has been "in the hospital again" [facility] spoke with her last but did not say when she was discharged  She reports not going to follow up with pcp Dr Juanetta GoslingHawkins. THN CM noted in EPIC chart hx that she was seen out patient by Dr Corliss Skainseveshwar on 01/28/17 for evaluation of recently discovered intracranial arterial stenoses Presented for cerebral angiogram for left MCA (middle cerebral artery)  stenosis THN Cm reviewed the Transition of care assessment questions with [name] [name] but unable to review her medications with her. [facility] CM again [facility] if [facility] could complete a home visit with her and was informed that she would [facility] after she has spoken with Dr Juanetta GoslingHawkins.  THN CM left THN CM mobile number for a return call to CM  [facility]ans [facility] CM will continue to follow for transition of care services weekly  [facility] will route and send copy of [facility] notes for today to pcp Dr Maren BeachHawkins  [name] L. Noelle PennerGibbs, RN, BSN, CCM [facility] Care Management [phone]

## 2017-02-02 ENCOUNTER — Encounter (HOSPITAL_COMMUNITY): Payer: Self-pay | Admitting: Interventional Radiology

## 2017-03-10 DEATH — deceased

## 2017-03-16 ENCOUNTER — Telehealth: Payer: Self-pay | Admitting: *Deleted

## 2017-03-16 NOTE — Patient Outreach (Addendum)
[facility]N) Care Management  03/16/2017  Nehemiah Massedlma S Cofer 06/14/1932 [id number]   [facility] had spoke with Mrs Iran OuchStrader for Transition of care but she would never agree to have [facility] CM complete a home visit with her in her home or at any MD office.  Mrs Iran OuchStrader refused to complete readiness to participate assessment and [facility] CM care plan goals - Dr Juanetta GoslingHawkins was updated NO barrier letter nor letter sent to patient after she was very reluctant to engage in services but would only speak with [facility] CM briefly on the telephone  [facility] CM updated on March 11 2017 by Dr Juanetta GoslingHawkins office staff members x 2 [facility](Becky & receptionist) of Mrs Sherren MochaStrader's passing away/decease status  Plans [facility] CM will close case with closure reason; member deceased- closure letter sent to Dr Maren BeachHawkins  Seila Liston L. Noelle PennerGibbs, RN, BSN, CCM [facility] Care Management [phone]

## 2017-07-16 ENCOUNTER — Ambulatory Visit: Payer: PPO | Admitting: Family

## 2017-07-16 ENCOUNTER — Encounter (HOSPITAL_COMMUNITY): Payer: PPO

## 2018-10-23 IMAGING — CT CT ANGIO NECK
2 of 12 series · 6 of 36 positions shown · IV contrast (OMNI 350)
Comparison: Brain MRI and intracranial MRA 12/07/2016 and earlier.

CLINICAL DATA: 84-year-old female with left arm numbness. Transient
ischemic attack.

EXAM:
CT ANGIOGRAPHY HEAD AND NECK
TECHNIQUE: Multidetector CT imaging of the head and neck was performed using
the standard protocol during bolus administration of intravenous
contrast. Multiplanar CT image reconstructions and MIPs were
obtained to evaluate the vascular anatomy. Carotid stenosis
measurements (when applicable) are obtained utilizing NASCET
criteria, using the distal internal carotid diameter as the
denominator.
CONTRAST:  50 mL Isovue 370

[Series 7: cta neck axial · axial · 0.39mm/px · z∈[-241,-26]mm · 5 of 326 slices shown]
[im 55/326  soft-tissue]
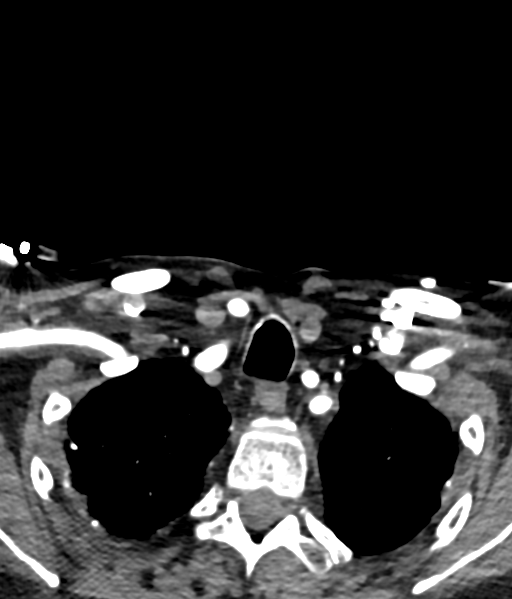
[im 109/326  bone]
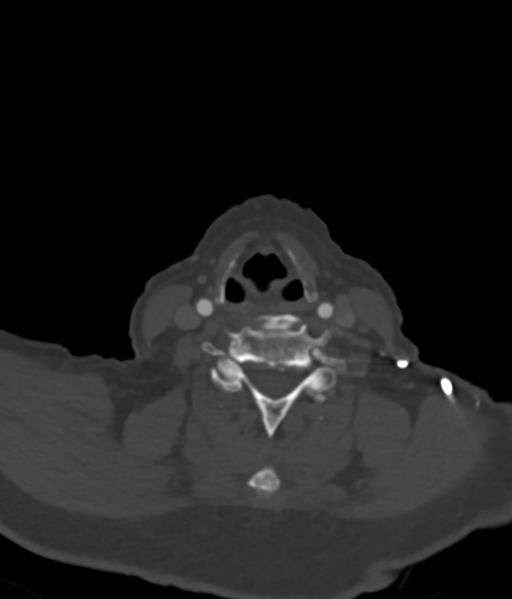
[im 163/326  soft-tissue]
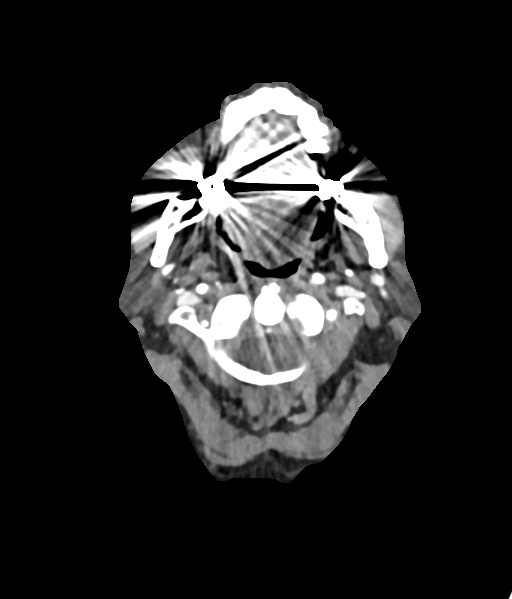
[im 217/326  bone]
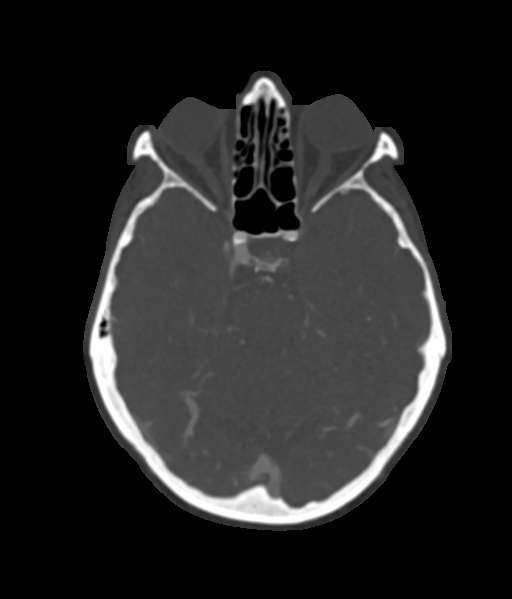
[im 271/326  soft-tissue]
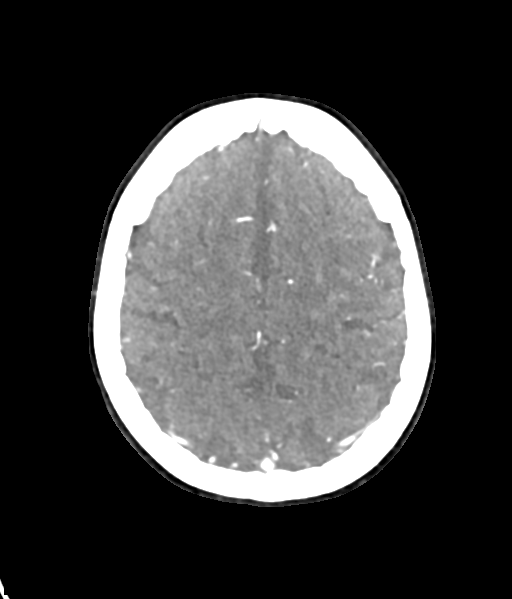

[Series 17: sagittal · sagittal · 0.30mm/px · 1 of 62 slices shown]
[im 14/62  soft-tissue]
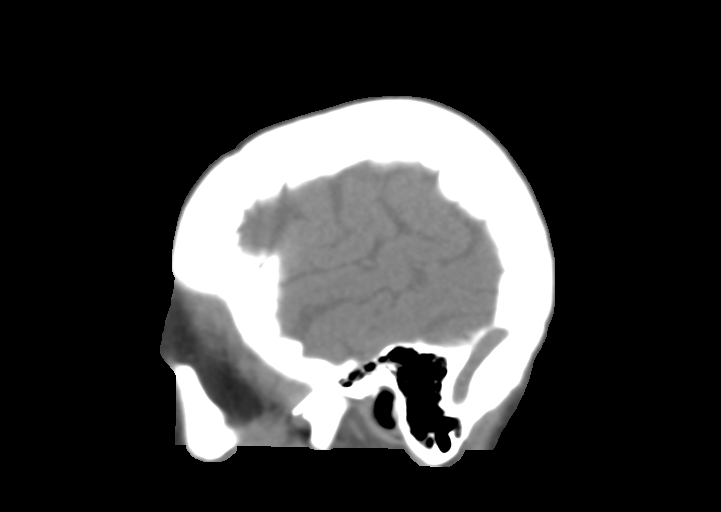

[6 of 36 positions shown; findings below may reference images not displayed]

FINDINGS: CTA NECK

Skeleton: Degenerative changes in the cervical spine. Partially
visible upper thoracic scoliosis. No acute osseous abnormality
identified.

Visualized paranasal sinuses and mastoids are stable and well
pneumatized.

Upper chest: Negative lung apices. No superior mediastinal
lymphadenopathy.

Other neck: Negative.  No cervical lymphadenopathy.

Aortic arch: 3 vessel arch configuration. Calcified aortic
atherosclerosis.

Right carotid system: Mildly tortuous but otherwise negative right
CCA. Confluent calcified plaque at the right carotid bifurcation,
medial left ICA origin and bulb, but less than 50 % stenosis with
respect to the distal vessel occurs. Tortuous right ICA at the C2
level.

Left carotid system: Minimal calcified plaque at the left CCA origin
without stenosis. Mild soft plaque at the lateral left ICA origin.
Otherwise negative cervical left ICA.

Vertebral arteries:

No proximal right subclavian artery stenosis. Normal right vertebral
artery origin. Tortuous but otherwise normal right vertebral artery
to the skullbase.

No proximal left subclavian artery stenosis despite calcified
plaque. Normal left vertebral artery origin. The left vertebral is
mildly dominant. The left vertebral is intermittently tortuous but
otherwise normal to the skullbase.

CTA HEAD

Posterior circulation: Mildly dominant distal left vertebral artery.
The right vertebral functionally terminates in PICA. Mild left V4
segment atherosclerosis without stenosis. Patent vertebrobasilar
junction. Patent but diminutive basilar artery. Patent SCA origins.
Fetal type bilateral PCA origins. Normal PCA branches.

Anterior circulation: Bilateral ICA siphon calcified plaque greater
on the right. No hemodynamically significant siphon stenosis. Normal
posterior communicating artery origins. Normal carotid termini, MCA
and ACA origins. Anterior communicating artery and bilateral ACA
branches are normal.

Left MCA M1 segment, and bifurcation are within normal limits. There
is a high-grade stenosis of of middle left MCA M2 branch affecting
its proximal 5-10 mm. This is best seen today on series 9, image 136
but also visible on series 451, image 8 of the MRA comparison.
Distally this branch appears normal. Other left MCA branches are
within normal limits.

Right MCA M1 segment, bifurcation, and right MCA branches are within
normal limits.

Venous sinuses: Patent. Asymmetric enhancement of the right
cavernous sinus in association with a prominent right sphenoparietal
sinus is felt to be physiologic.

Anatomic variants: Mildly dominant left vertebral artery. Fetal type
bilateral PCA origins.

Delayed phase: No abnormal enhancement identified. Stable gray-white
matter differentiation throughout the brain. Dystrophic
calcification in the inferior pons again noted.

Review of the MIP images confirms the above findings
IMPRESSION: 1. Negative for emergent large vessel occlusion.
2. Positive for a severe stenosis at the origin of the left MCA
middle M2 branch, with preserved distal patency.
3. Otherwise mild for age atherosclerosis. No other hemodynamically
significant stenosis identified in the head or neck.
4.  Stable CT appearance of the brain.
